# Patient Record
Sex: Female | Born: 1990
Health system: Southern US, Community
[De-identification: ages and names within clinical notes are randomized; demographics above are authoritative.]

## PROBLEM LIST (undated history)

## (undated) ENCOUNTER — Inpatient Hospital Stay (HOSPITAL_COMMUNITY): Payer: Self-pay

## (undated) DIAGNOSIS — M51369 Other intervertebral disc degeneration, lumbar region without mention of lumbar back pain or lower extremity pain: Secondary | ICD-10-CM

## (undated) DIAGNOSIS — M5136 Other intervertebral disc degeneration, lumbar region: Secondary | ICD-10-CM

## (undated) DIAGNOSIS — IMO0002 Reserved for concepts with insufficient information to code with codable children: Secondary | ICD-10-CM

## (undated) DIAGNOSIS — M479 Spondylosis, unspecified: Secondary | ICD-10-CM

## (undated) DIAGNOSIS — A64 Unspecified sexually transmitted disease: Secondary | ICD-10-CM

## (undated) DIAGNOSIS — K439 Ventral hernia without obstruction or gangrene: Secondary | ICD-10-CM

## (undated) HISTORY — PX: WISDOM TOOTH EXTRACTION: SHX21

## (undated) HISTORY — PX: CHOLECYSTECTOMY: SHX55

---
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
## 2012-07-29 ENCOUNTER — Other Ambulatory Visit (HOSPITAL_COMMUNITY)
Admission: RE | Admit: 2012-07-29 | Discharge: 2012-07-29 | Disposition: A | Discharge status: Home / Self Care | Payer: Self-pay | Source: Ambulatory Visit | Attending: Emergency Medicine | Admitting: Emergency Medicine

## 2012-07-29 ENCOUNTER — Encounter (HOSPITAL_COMMUNITY): Payer: Self-pay

## 2012-07-29 ENCOUNTER — Emergency Department (INDEPENDENT_AMBULATORY_CARE_PROVIDER_SITE_OTHER)
Admission: EM | Admit: 2012-07-29 | Discharge: 2012-07-29 | Disposition: A | Discharge status: Home / Self Care | Payer: Self-pay | Source: Home / Self Care | Attending: Emergency Medicine | Admitting: Emergency Medicine

## 2012-07-29 DIAGNOSIS — Z113 Encounter for screening for infections with a predominantly sexual mode of transmission: Secondary | ICD-10-CM | POA: Insufficient documentation

## 2012-07-29 DIAGNOSIS — N390 Urinary tract infection, site not specified: Secondary | ICD-10-CM

## 2012-07-29 DIAGNOSIS — N760 Acute vaginitis: Secondary | ICD-10-CM | POA: Insufficient documentation

## 2012-07-29 LAB — POCT URINALYSIS DIP (DEVICE)
Bilirubin Urine: NEGATIVE
Glucose, UA: NEGATIVE mg/dL
Ketones, ur: NEGATIVE mg/dL
Nitrite: NEGATIVE
Specific Gravity, Urine: 1.02 (ref 1.005–1.030)
pH: 8.5 — ABNORMAL HIGH (ref 5.0–8.0)

## 2012-07-29 LAB — POCT PREGNANCY, URINE: Preg Test, Ur: NEGATIVE

## 2012-07-29 MED ORDER — CEPHALEXIN 500 MG PO CAPS: 500.0000 mg | ORAL_CAPSULE | Freq: Three times a day (TID) | ORAL | Status: DC

## 2012-07-29 MED ORDER — PHENAZOPYRIDINE HCL 200 MG PO TABS: 200.0000 mg | ORAL_TABLET | Freq: Three times a day (TID) | ORAL | Status: DC | PRN

## 2012-07-29 NOTE — ED Provider Notes (Signed)
Chief Complaint:   Chief Complaint  Patient presents with  . Abdominal Pain    History of Present Illness:   Regina Nelson is a 22 year old female who has had a one-day history of urinary frequency, urgency, dysuria, blood in the urine, and lower abdominal and lower back pain. She denies fever, chills, nausea, or vomiting. She's had no GYN complaints but would like to be checked for STDs. She is sexually active without use of condoms although she does have an Implanad for contraception. No prior history of urinary tract infections.  Review of Systems:  Other than noted above, the patient denies any of the following symptoms: General:  No fevers, chills, sweats, aches, or fatigue. GI:  No abdominal pain, back pain, nausea, vomiting, diarrhea, or constipation. GU:  No dysuria, frequency, urgency, hematuria, or incontinence. GYN:  No discharge, itching, vulvar pain or lesions, pelvic pain, or abnormal vaginal bleeding.  PMFSH:  Past medical history, family history, social history, meds, and allergies were reviewed.    Physical Exam:   Vital signs:  BP 113/68  Pulse 61  Temp(Src) 98.1 F (36.7 C) (Oral)  Resp 16  SpO2 100% Gen:  Alert, oriented, in no distress. Lungs:  Clear to auscultation, no wheezes, rales or rhonchi. Heart:  Regular rhythm, no gallop or murmer. Abdomen:  Flat and soft. There was slight suprapubic pain to palpation.  No guarding, or rebound.  No hepato-splenomegaly or mass.  Bowel sounds were normally active.  No hernia. Pelvic exam:  Normal external genitalia, vaginal and cervical mucosa were normal. There was a tiny amount of blood coming from the cervical os. No pain on cervical motion. Uterus was normal in size and shape without any tenderness. No adnexal tenderness or mass. Back:  No CVA tenderness.  Skin:  Clear, warm and dry.  Labs:    Results for orders placed during the hospital encounter of 07/29/12  POCT URINALYSIS DIP (DEVICE)      Result Value Range    Glucose, UA NEGATIVE  NEGATIVE mg/dL   Bilirubin Urine NEGATIVE  NEGATIVE   Ketones, ur NEGATIVE  NEGATIVE mg/dL   Specific Gravity, Urine 1.020  1.005 - 1.030   Hgb urine dipstick LARGE (*) NEGATIVE   pH 8.5 (*) 5.0 - 8.0   Protein, ur 100 (*) NEGATIVE mg/dL   Urobilinogen, UA 1.0  0.0 - 1.0 mg/dL   Nitrite NEGATIVE  NEGATIVE   Leukocytes, UA SMALL (*) NEGATIVE  POCT PREGNANCY, URINE      Result Value Range   Preg Test, Ur NEGATIVE  NEGATIVE     A urine culture was obtained.  Results are pending at this time and we will call about any positive results. Also obtain DNA probes for gonorrhea, Chlamydia, Trichomonas, Gardnerella, Candida. Serology for HIV and syphilis also obtained.  Assessment: The encounter diagnosis was UTI (lower urinary tract infection).   This appears to be a simple lower urinary tract infection without evidence of pyelonephritis. She does not have any evidence of STDs today, but have screen for all of the major STDs.  Plan:   1.  The following meds were prescribed:   Discharge Medication List as of 07/29/2012  5:34 PM    START taking these medications   Details  cephALEXin (KEFLEX) 500 MG capsule Take 1 capsule (500 mg total) by mouth 3 (three) times daily., Starting 07/29/2012, Until Discontinued, Normal    phenazopyridine (PYRIDIUM) 200 MG tablet Take 1 tablet (200 mg total) by mouth 3 (three)  times daily as needed for pain., Starting 07/29/2012, Until Discontinued, Normal       2.  The patient was instructed in symptomatic care and handouts were given. 3.  The patient was told to return if becoming worse in any way, if no better in 3 or 4 days, and given some red flag symptoms such as fever or vomiting that would indicate earlier return. 4.  The patient was told to avoid intercourse for 10 days, get extra fluids, and return for a follow up with her primary care doctor at the completion of treatment for a repeat UA and culture.     Reuben Likes,  MD 07/29/12 [phone]

## 2012-07-29 NOTE — ED Notes (Signed)
C/o onset UA frequency, pain lower abdominal area . Last BM aprox 2 days ago; no concerns for STD at this time

## 2012-07-30 LAB — HIV ANTIBODY (ROUTINE TESTING W REFLEX): HIV: NONREACTIVE

## 2012-07-31 ENCOUNTER — Emergency Department (INDEPENDENT_AMBULATORY_CARE_PROVIDER_SITE_OTHER)
Admission: EM | Admit: 2012-07-31 | Discharge: 2012-07-31 | Disposition: A | Discharge status: Home / Self Care | Payer: Self-pay | Source: Home / Self Care

## 2012-07-31 ENCOUNTER — Encounter (HOSPITAL_COMMUNITY): Payer: Self-pay | Admitting: *Deleted

## 2012-07-31 DIAGNOSIS — A5400 Gonococcal infection of lower genitourinary tract, unspecified: Secondary | ICD-10-CM

## 2012-07-31 DIAGNOSIS — A499 Bacterial infection, unspecified: Secondary | ICD-10-CM

## 2012-07-31 DIAGNOSIS — B9689 Other specified bacterial agents as the cause of diseases classified elsewhere: Secondary | ICD-10-CM

## 2012-07-31 DIAGNOSIS — A549 Gonococcal infection, unspecified: Secondary | ICD-10-CM

## 2012-07-31 DIAGNOSIS — N760 Acute vaginitis: Secondary | ICD-10-CM

## 2012-07-31 LAB — URINE CULTURE: Special Requests: NORMAL

## 2012-07-31 MED ORDER — AZITHROMYCIN 250 MG PO TABS
1000.0000 mg | ORAL_TABLET | Freq: Once | ORAL | Status: AC
  Administered 2012-07-31: 1000 mg via ORAL

## 2012-07-31 MED ORDER — AZITHROMYCIN 250 MG PO TABS
ORAL_TABLET | ORAL | Status: AC
  Filled 2012-07-31: qty 4

## 2012-07-31 MED ORDER — METRONIDAZOLE 500 MG PO TABS: 500.0000 mg | ORAL_TABLET | Freq: Two times a day (BID) | ORAL | Status: DC

## 2012-07-31 MED ORDER — CEFTRIAXONE SODIUM 250 MG IJ SOLR
250.0000 mg | Freq: Once | INTRAMUSCULAR | Status: AC
  Administered 2012-07-31: 250 mg via INTRAMUSCULAR

## 2012-07-31 MED ORDER — CEFTRIAXONE SODIUM 250 MG IJ SOLR
INTRAMUSCULAR | Status: AC
  Filled 2012-07-31: qty 250

## 2012-07-31 NOTE — ED Provider Notes (Signed)
Medical screening examination/treatment/procedure(s) were performed by non-physician practitioner and as supervising physician I was immediately available for consultation/collaboration.  Leslee Home, M.D.   Reuben Likes, MD 07/31/12 [phone]

## 2012-07-31 NOTE — ED Notes (Addendum)
GC pos., Chlamydia neg., Affirm: Candida and Trich neg., Gardnerella pos.,  HIV/RPR non-reactive, Urine culture pending.  Pt. called by Dr. Lorenz Coaster to come back for tx. on 6/11. Pt. came in today and was treated with Rocephin and Zithromax. Pt. got a Rx. for Flagyl for bacterial vaginosis.   DHHS form completed and faxed to the [facility] Department. Regina Nelson 07/31/2012 Urine culture >100,000 colonies E. Coli. Pt. adequately treated with Keflex. 08/01/2012

## 2012-07-31 NOTE — ED Notes (Signed)
Chart review for recheck

## 2012-07-31 NOTE — ED Notes (Signed)
Pt  States   She  Was  Told  By  Dr  Lorenz Coaster  To  Return  For  tx   Of  Positive  Test  result

## 2012-07-31 NOTE — ED Provider Notes (Signed)
History     CSN: [id number]  Arrival date & time 07/31/12  1111   First MD Initiated Contact with Patient 07/31/12 1249      Chief Complaint  Patient presents with  . Follow-up    (Consider location/radiation/quality/duration/timing/severity/associated sxs/prior treatment) HPI Comments: The patient presents for followup. Her labs were positive for BV and and for gonorrhea so she was called and told to come in for followup.. She is currently being treated for a urinary tract infection with Keflex. She denies any nausea, vomiting, abdominal pain, fever, chills, vaginal discharge.   Past Medical History  Diagnosis Date  . Degenerative disc disease     Past Surgical History  Procedure Laterality Date  . Cholecystectomy      Family History  Problem Relation Age of Onset  . Colon cancer Neg Hx   . Cervical cancer Maternal Aunt     History  Substance Use Topics  . Smoking status: Never Smoker   . Smokeless tobacco: Never Used  . Alcohol Use: No    OB History   Grav Para Term Preterm Abortions TAB SAB Ect Mult Living                  Review of Systems  Constitutional: Negative for fever and chills.  Eyes: Negative for visual disturbance.  Respiratory: Negative for cough and shortness of breath.   Cardiovascular: Negative for chest pain, palpitations and leg swelling.  Gastrointestinal: Negative for nausea, vomiting and abdominal pain.  Endocrine: Negative for polydipsia and polyuria.  Genitourinary: Negative for dysuria, urgency and frequency.  Musculoskeletal: Negative for myalgias and arthralgias.  Skin: Negative for rash.  Neurological: Negative for dizziness, weakness and light-headedness.    Allergies  Review of patient's allergies indicates no known allergies.  Home Medications   Current Outpatient Rx  Name  Route  Sig  Dispense  Refill  . acetaminophen (TYLENOL) 500 MG tablet   Oral   Take 1,000 mg by mouth every 6 (six) hours as needed. For pain        . EXPIRED: amoxicillin-clarithromycin-lansoprazole (PREVPAC) combo pack   Oral   Take by mouth 2 (two) times daily. Follow package directions.   1 kit   0   . cephALEXin (KEFLEX) 500 MG capsule   Oral   Take 1 capsule (500 mg total) by mouth 3 (three) times daily.   30 capsule   0   . Etonogestrel (IMPLANON [place])   Subcutaneous   Inject into the skin.           Marland Kitchen metroNIDAZOLE (FLAGYL) 500 MG tablet   Oral   Take 1 tablet (500 mg total) by mouth 2 (two) times daily.   14 tablet   0   . EXPIRED: omeprazole (PRILOSEC) 40 MG capsule   Oral   Take 1 capsule (40 mg total) by mouth daily.   90 capsule   3   . phenazopyridine (PYRIDIUM) 200 MG tablet   Oral   Take 1 tablet (200 mg total) by mouth 3 (three) times daily as needed for pain.   15 tablet   0     BP 118/72  Pulse 72  Temp(Src) 98.6 F (37 C) (Oral)  Resp 16  SpO2 100%  LMP 07/20/2012  Physical Exam  Nursing note and vitals reviewed. Constitutional: She is oriented to person, place, and time. Vital signs are normal. She appears well-developed and well-nourished. No distress.  HENT:  Head: Normocephalic and atraumatic.  Abdominal: There is  no tenderness.  Neurological: She is alert and oriented to person, place, and time. She has normal strength.  Skin: Skin is warm and dry. She is not diaphoretic.  Psychiatric: She has a normal mood and affect. Her behavior is normal. Judgment normal.    ED Course  Procedures (including critical care time)  Labs Reviewed - No data to display No results found.   1. BV (bacterial vaginosis)   2. Gonorrhea       MDM  The patient had a lot of questions about what these diagnoses the mean and how she could have gotten them. All of these questions were answered to the patient's satisfaction  Rocephin and azithromycin given in the office. discharged with prescription for Flagyl.   Meds ordered this encounter  Medications  . cefTRIAXone (ROCEPHIN)  injection 250 mg    Sig:   . azithromycin (ZITHROMAX) tablet 1,000 mg    Sig:   . metroNIDAZOLE (FLAGYL) 500 MG tablet    Sig: Take 1 tablet (500 mg total) by mouth 2 (two) times daily.    Dispense:  14 tablet    Refill:  0         Graylon Good, PA-C 07/31/12 1558

## 2013-01-20 ENCOUNTER — Emergency Department (HOSPITAL_COMMUNITY)
Admission: EM | Admit: 2013-01-20 | Discharge: 2013-01-20 | Disposition: A | Discharge status: Home / Self Care | Payer: Self-pay | Attending: Emergency Medicine | Admitting: Emergency Medicine

## 2013-01-20 ENCOUNTER — Encounter (HOSPITAL_COMMUNITY): Payer: Self-pay | Admitting: Emergency Medicine

## 2013-01-20 DIAGNOSIS — Z8619 Personal history of other infectious and parasitic diseases: Secondary | ICD-10-CM | POA: Insufficient documentation

## 2013-01-20 DIAGNOSIS — N390 Urinary tract infection, site not specified: Secondary | ICD-10-CM | POA: Insufficient documentation

## 2013-01-20 DIAGNOSIS — N879 Dysplasia of cervix uteri, unspecified: Secondary | ICD-10-CM | POA: Insufficient documentation

## 2013-01-20 DIAGNOSIS — Z3202 Encounter for pregnancy test, result negative: Secondary | ICD-10-CM | POA: Insufficient documentation

## 2013-01-20 DIAGNOSIS — Z79899 Other long term (current) drug therapy: Secondary | ICD-10-CM | POA: Insufficient documentation

## 2013-01-20 DIAGNOSIS — R197 Diarrhea, unspecified: Secondary | ICD-10-CM | POA: Insufficient documentation

## 2013-01-20 HISTORY — DX: Unspecified sexually transmitted disease: A64

## 2013-01-20 LAB — CBC WITH DIFFERENTIAL/PLATELET
Eosinophils Absolute: 0.1 10*3/uL (ref 0.0–0.7)
Eosinophils Relative: 1 % (ref 0–5)
HCT: 43.4 % (ref 36.0–46.0)
Lymphocytes Relative: 40 % (ref 12–46)
Lymphs Abs: 3.6 10*3/uL (ref 0.7–4.0)
MCH: 28.6 pg (ref 26.0–34.0)
MCV: 84.9 fL (ref 78.0–100.0)
Monocytes Absolute: 0.8 10*3/uL (ref 0.1–1.0)
Monocytes Relative: 9 % (ref 3–12)
Platelets: 252 10*3/uL (ref 150–400)
RBC: 5.11 MIL/uL (ref 3.87–5.11)
WBC: 9.1 10*3/uL (ref 4.0–10.5)

## 2013-01-20 LAB — URINALYSIS, ROUTINE W REFLEX MICROSCOPIC
Bilirubin Urine: NEGATIVE
Nitrite: NEGATIVE
Specific Gravity, Urine: 1.029 (ref 1.005–1.030)
Urobilinogen, UA: 1 mg/dL (ref 0.0–1.0)
pH: 6.5 (ref 5.0–8.0)

## 2013-01-20 LAB — COMPREHENSIVE METABOLIC PANEL
ALT: 20 U/L (ref 0–35)
BUN: 16 mg/dL (ref 6–23)
CO2: 24 mEq/L (ref 19–32)
Calcium: 9.4 mg/dL (ref 8.4–10.5)
Creatinine, Ser: 0.78 mg/dL (ref 0.50–1.10)
GFR calc Af Amer: >90 mL/min (ref >90)
GFR calc non Af Amer: >90 mL/min (ref >90)
Glucose, Bld: 95 mg/dL (ref 70–99)
Sodium: 137 mEq/L (ref 135–145)
Total Protein: 7.1 g/dL (ref 6.0–8.3)

## 2013-01-20 LAB — URINE MICROSCOPIC-ADD ON

## 2013-01-20 LAB — WET PREP, GENITAL
Trich, Wet Prep: NONE SEEN
Yeast Wet Prep HPF POC: NONE SEEN

## 2013-01-20 MED ORDER — CEFTRIAXONE SODIUM 250 MG IJ SOLR
250.0000 mg | Freq: Once | INTRAMUSCULAR | Status: AC
  Administered 2013-01-20: 250 mg via INTRAMUSCULAR
  Filled 2013-01-20: qty 250

## 2013-01-20 MED ORDER — AZITHROMYCIN 250 MG PO TABS
1000.0000 mg | ORAL_TABLET | Freq: Once | ORAL | Status: AC
  Administered 2013-01-20: 1000 mg via ORAL
  Filled 2013-01-20: qty 4

## 2013-01-20 MED ORDER — STERILE WATER FOR INJECTION IJ SOLN
INTRAMUSCULAR | Status: AC
  Administered 2013-01-20: 0.9 mL
  Filled 2013-01-20: qty 10

## 2013-01-20 MED ORDER — NITROFURANTOIN MONOHYD MACRO 100 MG PO CAPS: 100.0000 mg | ORAL_CAPSULE | Freq: Two times a day (BID) | ORAL | Status: DC

## 2013-01-20 NOTE — ED Notes (Signed)
Pt. reports low abdominal pain for 1 week , denies nausea , vomitting or diarrhea , no urinary discomfort or vagianal discharge . Pt. requesting STD check .

## 2013-01-20 NOTE — ED Provider Notes (Signed)
CSN: [id number]     Arrival date & time 01/20/13  1945 History   First MD Initiated Contact with Patient 01/20/13 2012     Chief Complaint  Patient presents with  . Abdominal Pain   (Consider location/radiation/quality/duration/timing/severity/associated sxs/prior Treatment) HPI Comments: Patient is a 22 year old female who presents for lower abdominal pain x 1 week. Patient states the pain is intermittent and cramping in nature. She has taken Tylenol for her pain with mild relief. Patient denies any aggravating factors of her symptoms or any triggers which causes recurrence of her abdominal pain. Patient does endorse some sporadic watery, nonbloody diarrhea. Patient denies associated fever, chest pain, shortness of breath, nausea or vomiting, melena or hematochezia, urinary symptoms, vaginal discharge, or numbness or tingling. Last menstrual period was 2 months ago when patient endorses being sexually active.  Patient is a 22 y.o. female presenting with abdominal pain. The history is provided by the patient. No language interpreter was used.  Abdominal Pain Associated symptoms: diarrhea     Past Medical History  Diagnosis Date  . Degenerative disc disease   . STD (female)    Past Surgical History  Procedure Laterality Date  . Cholecystectomy     Family History  Problem Relation Age of Onset  . Colon cancer Neg Hx   . Cervical cancer Maternal Aunt    History  Substance Use Topics  . Smoking status: Never Smoker   . Smokeless tobacco: Never Used  . Alcohol Use: No   OB History   Grav Para Term Preterm Abortions TAB SAB Ect Mult Living                 Review of Systems  Gastrointestinal: Positive for abdominal pain and diarrhea.  All other systems reviewed and are negative.    Allergies  Review of patient's allergies indicates no known allergies.  Home Medications   Current Outpatient Rx  Name  Route  Sig  Dispense  Refill  . acetaminophen (TYLENOL) 500 MG tablet  Oral   Take 1,000 mg by mouth every 6 (six) hours as needed. For pain         . EXPIRED: amoxicillin-clarithromycin-lansoprazole (PREVPAC) combo pack   Oral   Take by mouth 2 (two) times daily. Follow package directions.   1 kit   0   . nitrofurantoin, macrocrystal-monohydrate, (MACROBID) 100 MG capsule   Oral   Take 1 capsule (100 mg total) by mouth 2 (two) times daily.   10 capsule   0   . EXPIRED: omeprazole (PRILOSEC) 40 MG capsule   Oral   Take 1 capsule (40 mg total) by mouth daily.   90 capsule   3    BP 118/76  Pulse 67  Resp 16  SpO2 96%  Physical Exam  Nursing note and vitals reviewed. Constitutional: She is oriented to person, place, and time. She appears well-developed and well-nourished. No distress.  HENT:  Head: Normocephalic and atraumatic.  Mouth/Throat: Oropharynx is clear and moist. No oropharyngeal exudate.  Eyes: Conjunctivae and EOM are normal. Pupils are equal, round, and reactive to light. No scleral icterus.  Neck: Normal range of motion.  Cardiovascular: Normal rate, regular rhythm and normal heart sounds.   Pulmonary/Chest: Effort normal and breath sounds normal. No respiratory distress. She has no wheezes. She has no rales.  Abdominal: Soft. She exhibits no distension. There is tenderness (mild, lower abdomen). There is no rebound and no guarding.    No peritoneal signs or guarding.  Genitourinary:  Vagina normal. There is no rash, tenderness, lesion or injury on the right labia. There is no rash, tenderness, lesion or injury on the left labia. Uterus is not tender. Cervix exhibits no motion tenderness, no discharge and no friability. Right adnexum displays no mass, no tenderness and no fullness. Left adnexum displays no mass, no tenderness and no fullness.  +cervical dysplasia around os  Musculoskeletal: Normal range of motion.  Neurological: She is alert and oriented to person, place, and time.  Skin: Skin is warm and dry. No rash noted. She  is not diaphoretic. No erythema. No pallor.  Psychiatric: She has a normal mood and affect. Her behavior is normal.    ED Course  Procedures (including critical care time) Labs Review Labs Reviewed  WET PREP, GENITAL - Abnormal; Notable for the following:    WBC, Wet Prep HPF POC FEW (*)    All other components within normal limits  COMPREHENSIVE METABOLIC PANEL - Abnormal; Notable for the following:    Total Bilirubin 0.2 (*)    All other components within normal limits  URINALYSIS, ROUTINE W REFLEX MICROSCOPIC - Abnormal; Notable for the following:    APPearance HAZY (*)    Hgb urine dipstick LARGE (*)    Ketones, ur 15 (*)    Leukocytes, UA MODERATE (*)    All other components within normal limits  URINE MICROSCOPIC-ADD ON - Abnormal; Notable for the following:    Squamous Epithelial / LPF FEW (*)    Bacteria, UA MANY (*)    All other components within normal limits  GC/CHLAMYDIA PROBE AMP  URINE CULTURE  CBC WITH DIFFERENTIAL  LIPASE, BLOOD  POCT PREGNANCY, URINE   Imaging Review No results found.  EKG Interpretation   None       MDM   1. UTI (urinary tract infection)    22 year old female presents today for lower abdominal pain times one week. Patient well and nontoxic appearing and hemodynamically stable, and afebrile. Physical exam today significant for mild tenderness to palpation in the suprapubic region. Labs are unremarkable and urine pregnancy negative. Urinalysis today suggests UTI given many bacteria and moderate leukocytes and presents a few squamous cells. GC chlamydia pending.  Have reviewed these findings with the patient in their entirety who verbalizes understanding. She states that she has some concern for STDs as she has contracted this from her boyfriend in the past. She endorses unprotected sexual intercourse. Will treat the patient today with IM Rocephin and azithromycin and have instructed the patient to followup on her gonorrhea and Chlamydia  culture. Patient stable for discharge with prescription for Macrobid for urinary tract infection. Have also provided referral to women's outpatient clinic. Return precautions discussed and patient agreeable to plan with no unaddressed concerns.  Antony Madura, PA-C 01/20/13 2214

## 2013-01-21 ENCOUNTER — Emergency Department (HOSPITAL_COMMUNITY)
Admission: EM | Admit: 2013-01-21 | Discharge: 2013-01-21 | Disposition: A | Discharge status: Home / Self Care | Payer: Self-pay | Attending: Emergency Medicine | Admitting: Emergency Medicine

## 2013-01-21 ENCOUNTER — Encounter (HOSPITAL_COMMUNITY): Payer: Self-pay | Admitting: Emergency Medicine

## 2013-01-21 DIAGNOSIS — N390 Urinary tract infection, site not specified: Secondary | ICD-10-CM | POA: Insufficient documentation

## 2013-01-21 DIAGNOSIS — Z791 Long term (current) use of non-steroidal anti-inflammatories (NSAID): Secondary | ICD-10-CM | POA: Insufficient documentation

## 2013-01-21 DIAGNOSIS — Z8619 Personal history of other infectious and parasitic diseases: Secondary | ICD-10-CM | POA: Insufficient documentation

## 2013-01-21 DIAGNOSIS — IMO0002 Reserved for concepts with insufficient information to code with codable children: Secondary | ICD-10-CM | POA: Insufficient documentation

## 2013-01-21 DIAGNOSIS — R109 Unspecified abdominal pain: Secondary | ICD-10-CM

## 2013-01-21 DIAGNOSIS — Z79899 Other long term (current) drug therapy: Secondary | ICD-10-CM | POA: Insufficient documentation

## 2013-01-21 DIAGNOSIS — R1084 Generalized abdominal pain: Secondary | ICD-10-CM | POA: Insufficient documentation

## 2013-01-21 DIAGNOSIS — Z9089 Acquired absence of other organs: Secondary | ICD-10-CM | POA: Insufficient documentation

## 2013-01-21 LAB — GC/CHLAMYDIA PROBE AMP: CT Probe RNA: NEGATIVE

## 2013-01-21 MED ORDER — ONDANSETRON 8 MG PO TBDP
8.0000 mg | ORAL_TABLET | Freq: Once | ORAL | Status: AC
  Administered 2013-01-21: 8 mg via ORAL
  Filled 2013-01-21: qty 1

## 2013-01-21 MED ORDER — PHENAZOPYRIDINE HCL 200 MG PO TABS: 200.0000 mg | ORAL_TABLET | Freq: Three times a day (TID) | ORAL | Status: DC

## 2013-01-21 MED ORDER — TRAMADOL HCL 50 MG PO TABS
50.0000 mg | ORAL_TABLET | Freq: Four times a day (QID) | ORAL | Status: DC | PRN
  Administered 2013-01-21: 50 mg via ORAL
  Filled 2013-01-21: qty 1

## 2013-01-21 MED ORDER — TRAMADOL HCL 50 MG PO TABS: 50.0000 mg | ORAL_TABLET | Freq: Four times a day (QID) | ORAL | Status: DC | PRN

## 2013-01-21 MED ORDER — ONDANSETRON 8 MG PO TBDP: 8.0000 mg | ORAL_TABLET | Freq: Once | ORAL | Status: DC

## 2013-01-21 MED ORDER — NAPROXEN 500 MG PO TABS: 500.0000 mg | ORAL_TABLET | Freq: Two times a day (BID) | ORAL | Status: DC

## 2013-01-21 MED ORDER — NAPROXEN 500 MG PO TABS
500.0000 mg | ORAL_TABLET | Freq: Two times a day (BID) | ORAL | Status: DC
  Administered 2013-01-21: 500 mg via ORAL
  Filled 2013-01-21: qty 1

## 2013-01-21 MED ORDER — PHENAZOPYRIDINE HCL 200 MG PO TABS
200.0000 mg | ORAL_TABLET | Freq: Three times a day (TID) | ORAL | Status: DC
  Administered 2013-01-21: 200 mg via ORAL
  Filled 2013-01-21: qty 1

## 2013-01-21 NOTE — ED Notes (Signed)
Dr Otter at bedside  

## 2013-01-21 NOTE — ED Provider Notes (Signed)
Medical screening examination/treatment/procedure(s) were performed by non-physician practitioner and as supervising physician I was immediately available for consultation/collaboration.  EKG Interpretation   None         Candyce Churn, MD 01/21/13 1250

## 2013-01-21 NOTE — ED Notes (Signed)
Bed: ZO10 Expected date:  Expected time:  Means of arrival:  Comments: EMS 22 yo F, abd pain; dc'd from [facility] at 2300

## 2013-01-21 NOTE — ED Notes (Signed)
Per PTAR report: pt seen earlier at [facility] for similar complaints and pt was dx for a UTI.  Pt left [facility] at 23:00 on 12/2 and pain began about 30 minutes ago.  Pain is similar to before.  PTAR reports pain is tender to palpation.  Pt a/o x 4 and Skin warm and dry.

## 2013-01-21 NOTE — ED Provider Notes (Signed)
CSN: [id number]     Arrival date & time 01/21/13  0121 History   First MD Initiated Contact with Patient 01/21/13 0123     Chief Complaint  Patient presents with  . Abdominal Pain   (Consider location/radiation/quality/duration/timing/severity/associated sxs/prior Treatment) HPI 22 year old female presents to emergency room from home via EMS with complaint of diffuse abdominal pain.  Patient was discharged from [facility] emergency department about 2 hours ago.  Patient was seen for abdominal pain, diagnosed with possible STD and UTI.  She was given Rocephin and Zithromax.  Given prescription for nitrofurantoin.  Upon arrival home, patient reports diffuse pain that was sharp and crampy.  Prior, she reports she only had suprapubic pain.  This concerned her and her mother.  Who recommended calling an ambulance to come into the emergency room for reassessment.  No vomiting.  The patient has been nauseated.  No fevers.  Patient did not take any medications prior to arrival. Past Medical History  Diagnosis Date  . Degenerative disc disease   . STD (female)    Past Surgical History  Procedure Laterality Date  . Cholecystectomy     Family History  Problem Relation Age of Onset  . Colon cancer Neg Hx   . Cervical cancer Maternal Aunt    History  Substance Use Topics  . Smoking status: Never Smoker   . Smokeless tobacco: Never Used  . Alcohol Use: No   OB History   Grav Para Term Preterm Abortions TAB SAB Ect Mult Living                 Review of Systems  All other systems reviewed and are negative.    Allergies  Review of patient's allergies indicates no known allergies.  Home Medications   Current Outpatient Rx  Name  Route  Sig  Dispense  Refill  . acetaminophen (TYLENOL) 500 MG tablet   Oral   Take 1,000 mg by mouth every 6 (six) hours as needed. For pain         . etonogestrel (IMPLANON) 68 MG IMPL implant   Subcutaneous   Inject 1 each into the skin continuous.         . naproxen (NAPROSYN) 500 MG tablet   Oral   Take 1 tablet (500 mg total) by mouth 2 (two) times daily with a meal.   30 tablet   0   . nitrofurantoin, macrocrystal-monohydrate, (MACROBID) 100 MG capsule   Oral   Take 1 capsule (100 mg total) by mouth 2 (two) times daily.   10 capsule   0   . EXPIRED: omeprazole (PRILOSEC) 40 MG capsule   Oral   Take 1 capsule (40 mg total) by mouth daily.   90 capsule   3   . ondansetron (ZOFRAN-ODT) 8 MG disintegrating tablet   Oral   Take 1 tablet (8 mg total) by mouth once.   20 tablet   0   . phenazopyridine (PYRIDIUM) 200 MG tablet   Oral   Take 1 tablet (200 mg total) by mouth 3 (three) times daily with meals.   10 tablet   0   . traMADol (ULTRAM) 50 MG tablet   Oral   Take 1 tablet (50 mg total) by mouth every 6 (six) hours as needed for moderate pain.   30 tablet   0    BP 132/78  Pulse 65  Temp(Src) 97.9 F (36.6 C) (Oral)  Resp 18  SpO2 97% Physical Exam  Constitutional: She  is oriented to person, place, and time. She appears well-developed and well-nourished. She appears distressed.  HENT:  Head: Normocephalic and atraumatic.  Nose: Nose normal.  Mouth/Throat: Oropharynx is clear and moist.  Eyes: Conjunctivae and EOM are normal. Pupils are equal, round, and reactive to light.  Neck: Normal range of motion. Neck supple. No JVD present. No tracheal deviation present. No thyromegaly present.  Cardiovascular: Normal rate, regular rhythm, normal heart sounds and intact distal pulses.  Exam reveals no gallop and no friction rub.   No murmur heard. Pulmonary/Chest: Effort normal and breath sounds normal. No stridor. No respiratory distress. She has no wheezes. She has no rales. She exhibits no tenderness.  Abdominal: Soft. Bowel sounds are normal. She exhibits no distension and no mass. There is tenderness (diffuse tenderness). There is no rebound and no guarding.  Musculoskeletal: Normal range of motion. She exhibits no  edema and no tenderness.  Lymphadenopathy:    She has no cervical adenopathy.  Neurological: She is alert and oriented to person, place, and time. She exhibits normal muscle tone. Coordination normal.  Skin: Skin is warm and dry. No rash noted. No erythema. No pallor.  Psychiatric: She has a normal mood and affect. Her behavior is normal. Judgment and thought content normal.    ED Course  Procedures (including critical care time) Labs Review Labs Reviewed - No data to display Imaging Review No results found.  EKG Interpretation   None       MDM   1. Abdominal pain   2. Urinary tract infection    22 year old female with diffuse at all.  Pain.  She had a thorough examination in the emergency room just a few hours ago to include labs and pelvic exam.  Patient given Pyridium, Zofran, Naprosyn and Ultram here.  She is feeling better.  Will DC her home on same.    Olivia Mackie, MD 01/21/13 0630

## 2013-01-22 ENCOUNTER — Encounter (HOSPITAL_COMMUNITY): Payer: Self-pay | Admitting: Emergency Medicine

## 2013-01-22 ENCOUNTER — Emergency Department (HOSPITAL_COMMUNITY)
Admission: EM | Admit: 2013-01-22 | Discharge: 2013-01-22 | Disposition: A | Discharge status: Home / Self Care | Payer: Self-pay | Attending: Emergency Medicine | Admitting: Emergency Medicine

## 2013-01-22 DIAGNOSIS — R112 Nausea with vomiting, unspecified: Secondary | ICD-10-CM | POA: Insufficient documentation

## 2013-01-22 DIAGNOSIS — Z79899 Other long term (current) drug therapy: Secondary | ICD-10-CM | POA: Insufficient documentation

## 2013-01-22 DIAGNOSIS — R1084 Generalized abdominal pain: Secondary | ICD-10-CM | POA: Insufficient documentation

## 2013-01-22 DIAGNOSIS — Z791 Long term (current) use of non-steroidal anti-inflammatories (NSAID): Secondary | ICD-10-CM | POA: Insufficient documentation

## 2013-01-22 DIAGNOSIS — IMO0002 Reserved for concepts with insufficient information to code with codable children: Secondary | ICD-10-CM | POA: Insufficient documentation

## 2013-01-22 DIAGNOSIS — Z8619 Personal history of other infectious and parasitic diseases: Secondary | ICD-10-CM | POA: Insufficient documentation

## 2013-01-22 DIAGNOSIS — Z9089 Acquired absence of other organs: Secondary | ICD-10-CM | POA: Insufficient documentation

## 2013-01-22 DIAGNOSIS — R109 Unspecified abdominal pain: Secondary | ICD-10-CM

## 2013-01-22 LAB — CBC WITH DIFFERENTIAL/PLATELET
Basophils Relative: 0 % (ref 0–1)
Eosinophils Relative: 1 % (ref 0–5)
HCT: 43.4 % (ref 36.0–46.0)
Hemoglobin: 14.5 g/dL (ref 12.0–15.0)
Lymphocytes Relative: 30 % (ref 12–46)
Lymphs Abs: 3 10*3/uL (ref 0.7–4.0)
MCHC: 33.4 g/dL (ref 30.0–36.0)
MCV: 83.5 fL (ref 78.0–100.0)
Monocytes Absolute: 1 10*3/uL (ref 0.1–1.0)
Monocytes Relative: 11 % (ref 3–12)
Neutro Abs: 5.8 10*3/uL (ref 1.7–7.7)
RBC: 5.2 MIL/uL — ABNORMAL HIGH (ref 3.87–5.11)
RDW: 13.1 % (ref 11.5–15.5)

## 2013-01-22 LAB — COMPREHENSIVE METABOLIC PANEL
Albumin: 3.7 g/dL (ref 3.5–5.2)
Alkaline Phosphatase: 44 U/L (ref 39–117)
BUN: 14 mg/dL (ref 6–23)
CO2: 24 mEq/L (ref 19–32)
Calcium: 8.8 mg/dL (ref 8.4–10.5)
Chloride: 101 mEq/L (ref 96–112)
Creatinine, Ser: 0.73 mg/dL (ref 0.50–1.10)
GFR calc non Af Amer: >90 mL/min (ref >90)
Total Bilirubin: 0.3 mg/dL (ref 0.3–1.2)

## 2013-01-22 LAB — URINE CULTURE

## 2013-01-22 LAB — LIPASE, BLOOD: Lipase: 14 U/L (ref 11–59)

## 2013-01-22 MED ORDER — MORPHINE SULFATE 4 MG/ML IJ SOLN
4.0000 mg | Freq: Once | INTRAMUSCULAR | Status: AC
  Administered 2013-01-22: 4 mg via INTRAVENOUS
  Filled 2013-01-22: qty 1

## 2013-01-22 MED ORDER — PROMETHAZINE HCL 25 MG RE SUPP: 25.0000 mg | Freq: Four times a day (QID) | RECTAL | Status: DC | PRN

## 2013-01-22 MED ORDER — DICYCLOMINE HCL 10 MG/ML IM SOLN
20.0000 mg | Freq: Once | INTRAMUSCULAR | Status: AC
  Administered 2013-01-22: 20 mg via INTRAMUSCULAR
  Filled 2013-01-22: qty 2

## 2013-01-22 MED ORDER — SUCRALFATE 1 G PO TABS: 1.0000 g | ORAL_TABLET | Freq: Four times a day (QID) | ORAL | Status: DC

## 2013-01-22 MED ORDER — FAMOTIDINE 20 MG PO TABS: 20.0000 mg | ORAL_TABLET | Freq: Two times a day (BID) | ORAL | Status: DC

## 2013-01-22 MED ORDER — PANTOPRAZOLE SODIUM 40 MG IV SOLR
40.0000 mg | Freq: Once | INTRAVENOUS | Status: AC
  Administered 2013-01-22: 40 mg via INTRAVENOUS
  Filled 2013-01-22: qty 40

## 2013-01-22 MED ORDER — GI COCKTAIL ~~LOC~~
30.0000 mL | Freq: Once | ORAL | Status: AC
  Administered 2013-01-22: 30 mL via ORAL
  Filled 2013-01-22: qty 30

## 2013-01-22 MED ORDER — FENTANYL CITRATE 0.05 MG/ML IJ SOLN
50.0000 ug | Freq: Once | INTRAMUSCULAR | Status: AC
  Administered 2013-01-22: 50 ug via INTRAVENOUS
  Filled 2013-01-22: qty 2

## 2013-01-22 MED ORDER — ONDANSETRON 8 MG PO TBDP: 8.0000 mg | ORAL_TABLET | Freq: Three times a day (TID) | ORAL | Status: DC | PRN

## 2013-01-22 MED ORDER — ONDANSETRON HCL 4 MG/2ML IJ SOLN
4.0000 mg | Freq: Once | INTRAMUSCULAR | Status: AC
  Administered 2013-01-22: 4 mg via INTRAVENOUS
  Filled 2013-01-22: qty 2

## 2013-01-22 MED ORDER — SODIUM CHLORIDE 0.9 % IV BOLUS (SEPSIS)
1000.0000 mL | Freq: Once | INTRAVENOUS | Status: AC
  Administered 2013-01-22: 1000 mL via INTRAVENOUS

## 2013-01-22 NOTE — ED Provider Notes (Signed)
CSN: [id number]     Arrival date & time 01/22/13  0033 History   First MD Initiated Contact with Patient 01/22/13 0102     Chief Complaint  Patient presents with  . Abdominal Pain   (Consider location/radiation/quality/duration/timing/severity/associated sxs/prior Treatment) HPI 22 year old female presents to the emergency department from home via EMS with complaint of persistent abdominal pain, nausea and vomiting.  Pt was seen last night at [facility] with normal blood work, possible UTI, and tx for gc/chlamydia.  Pt was seen later in the night by me at [facility] due to persistent pain.  Pt was given naprosyn, tramadol, pyridium at that time, felt better.  Pt reports she could not afford the naprosyn or pyridium, but did fill the macrobid and ultram.  Pt reports persistent suprapubic pain, epigastric pain.  No fevers or chills.  She has had vomiting, but has been able to keep down her medications.     Past Medical History  Diagnosis Date  . Degenerative disc disease   . STD (female)    Past Surgical History  Procedure Laterality Date  . Cholecystectomy     Family History  Problem Relation Age of Onset  . Colon cancer Neg Hx   . Cervical cancer Maternal Aunt    History  Substance Use Topics  . Smoking status: Never Smoker   . Smokeless tobacco: Never Used  . Alcohol Use: No   OB History   Grav Para Term Preterm Abortions TAB SAB Ect Mult Living                 Review of Systems  All other systems reviewed and are negative.    Allergies  Review of patient's allergies indicates no known allergies.  Home Medications   Current Outpatient Rx  Name  Route  Sig  Dispense  Refill  . acetaminophen (TYLENOL) 500 MG tablet   Oral   Take 1,000 mg by mouth every 6 (six) hours as needed. For pain         . naproxen (NAPROSYN) 500 MG tablet   Oral   Take 1 tablet (500 mg total) by mouth 2 (two) times daily with a meal.   30 tablet   0   . nitrofurantoin, macrocrystal-monohydrate,  (MACROBID) 100 MG capsule   Oral   Take 1 capsule (100 mg total) by mouth 2 (two) times daily.   10 capsule   0   . phenazopyridine (PYRIDIUM) 200 MG tablet   Oral   Take 1 tablet (200 mg total) by mouth 3 (three) times daily with meals.   10 tablet   0   . traMADol (ULTRAM) 50 MG tablet   Oral   Take 1 tablet (50 mg total) by mouth every 6 (six) hours as needed for moderate pain.   30 tablet   0   . etonogestrel (IMPLANON) 68 MG IMPL implant   Subcutaneous   Inject 1 each into the skin continuous.          BP 107/71  Pulse 54  Temp(Src) 97.7 F (36.5 C) (Oral)  Resp 20  SpO2 97% Physical Exam  Nursing note and vitals reviewed. Constitutional: She is oriented to person, place, and time. She appears well-developed and well-nourished. She appears distressed (uncomfortable appearing).  HENT:  Head: Normocephalic and atraumatic.  Nose: Nose normal.  Mouth/Throat: Oropharynx is clear and moist.  Eyes: Conjunctivae and EOM are normal. Pupils are equal, round, and reactive to light.  Neck: Normal range of motion. Neck  supple. No JVD present. No tracheal deviation present. No thyromegaly present.  Cardiovascular: Normal rate, regular rhythm, normal heart sounds and intact distal pulses.  Exam reveals no gallop and no friction rub.   No murmur heard. Pulmonary/Chest: Effort normal and breath sounds normal. No stridor. No respiratory distress. She has no wheezes. She has no rales. She exhibits no tenderness.  Abdominal: Soft. She exhibits no distension and no mass. There is tenderness (diffuse tenderness). There is no rebound and no guarding.  Decreased bowel sounds  Musculoskeletal: Normal range of motion. She exhibits no edema and no tenderness.  Lymphadenopathy:    She has no cervical adenopathy.  Neurological: She is alert and oriented to person, place, and time. She exhibits normal muscle tone. Coordination normal.  Skin: Skin is warm and dry. No rash noted. No erythema.  No pallor.  Psychiatric: She has a normal mood and affect. Her behavior is normal. Judgment and thought content normal.    ED Course  Procedures (including critical care time) Labs Review Labs Reviewed  CBC WITH DIFFERENTIAL - Abnormal; Notable for the following:    RBC 5.20 (*)    All other components within normal limits  COMPREHENSIVE METABOLIC PANEL - Abnormal; Notable for the following:    Glucose, Bld 156 (*)    All other components within normal limits  LIPASE, BLOOD   Imaging Review No results found.  EKG Interpretation   None       MDM   1. Abdominal pain   2. Nausea and vomiting    22 yo female with persistent abd pain, n/v now.  Will plan to repeat labs, give ivf, pain/nausea medications.    3:27 AM Repeat labs again reassuring.  Pt has been able to keep down cracker and gingerale.  Will d/c home with pepcid, carafate, nausea medications.   Olivia Mackie, MD 01/22/13 [phone]

## 2013-01-22 NOTE — ED Notes (Signed)
Was seen in [facility] last night and [facility] earlier yesterday for same c/o stomach pain.  Reports being treated for UTI and given tramadol for pain but it is not working.

## 2013-04-27 ENCOUNTER — Inpatient Hospital Stay (HOSPITAL_COMMUNITY)
Admission: AD | Admit: 2013-04-27 | Discharge: 2013-04-27 | Disposition: A | Discharge status: Home / Self Care | Payer: BC Managed Care – PPO | Source: Ambulatory Visit | Attending: Obstetrics & Gynecology | Admitting: Obstetrics & Gynecology

## 2013-04-27 ENCOUNTER — Encounter (HOSPITAL_COMMUNITY): Payer: Self-pay | Admitting: *Deleted

## 2013-04-27 DIAGNOSIS — Z3202 Encounter for pregnancy test, result negative: Secondary | ICD-10-CM

## 2013-04-27 LAB — HCG, QUANTITATIVE, PREGNANCY: hCG, Beta Chain, Quant, S: 1 m[IU]/mL (ref <5)

## 2013-04-27 LAB — URINALYSIS, ROUTINE W REFLEX MICROSCOPIC
BILIRUBIN URINE: NEGATIVE
GLUCOSE, UA: NEGATIVE mg/dL
Ketones, ur: NEGATIVE mg/dL
Leukocytes, UA: NEGATIVE
Nitrite: NEGATIVE
Protein, ur: NEGATIVE mg/dL
Specific Gravity, Urine: >1.03 — ABNORMAL HIGH (ref 1.005–1.030)
Urobilinogen, UA: 0.2 mg/dL (ref 0.0–1.0)
pH: 6 (ref 5.0–8.0)

## 2013-04-27 LAB — URINE MICROSCOPIC-ADD ON

## 2013-04-27 NOTE — Discharge Instructions (Signed)
Oral Contraception Information  Oral contraceptive pills (OCPs) are medicines taken to prevent pregnancy. OCPs work by preventing the ovaries from releasing eggs. The hormones in OCPs also cause the cervical mucus to thicken, preventing the sperm from entering the uterus. The hormones also cause the uterine lining to become thin, not allowing a fertilized egg to attach to the inside of the uterus. OCPs are highly effective when taken exactly as prescribed. However, OCPs do not prevent sexually transmitted diseases (STDs). Safe sex practices, such as using condoms along with the pill, can help prevent STDs.   Before taking the pill, you may have a physical exam and Pap test. Your health care provider may order blood tests. The health care provider will make sure you are a good candidate for oral contraception. Discuss with your health care provider the possible side effects of the OCP you may be prescribed. When starting an OCP, it can take 2 to 3 months for the body to adjust to the changes in hormone levels in your body.   TYPES OF ORAL CONTRACEPTION  · The combination pill This pill contains estrogen and progestin (synthetic progesterone) hormones. The combination pill comes in 21-day, 28-day, or 91-day packs. Some types of combination pills are meant to be taken continuously (365-day pills). With 21-day packs, you do not take pills for 7 days after the last pill. With 28-day packs, the pill is taken every day. The last 7 pills are without hormones. Certain types of pills have more than 21 hormone-containing pills. With 91-day packs, the first 84 pills contain both hormones, and the last 7 pills contain no hormones or contain estrogen only.  · The minipill This pill contains the progesterone hormone only. The pill is taken every day continuously. It is very important to take the pill at the same time each day. The minipill comes in packs of 28 pills. All 28 pills contain the hormone.    ADVANTAGES OF ORAL  CONTRACEPTIVE PILLS  · Decreases premenstrual symptoms.    · Treats menstrual period cramps.    · Regulates the menstrual cycle.    · Decreases a heavy menstrual flow.    · May treat acne, depending on the type of pill.    · Treats abnormal uterine bleeding.    · Treats polycystic ovarian syndrome.    · Treats endometriosis.    · Can be used as emergency contraception.    THINGS THAT CAN MAKE ORAL CONTRACEPTIVE PILLS LESS EFFECTIVE  OCPs can be less effective if:   · You forget to take the pill at the same time every day.    · You have a stomach or intestinal disease that lessens the absorption of the pill.    · You take OCPs with other medicines that make OCPs less effective, such as antibiotics, certain HIV medicines, and some seizure medicines.    · You take expired OCPs.    · You forget to restart the pill on day 7, when using the packs of 21 pills.    RISKS ASSOCIATED WITH ORAL CONTRACEPTIVE PILLS   Oral contraceptive pills can sometimes cause side effects, such as:  · Headache.  · Nausea.  · Breast tenderness.  · Irregular bleeding or spotting.  Combination pills are also associated with a small increased risk of:  · Blood clots.  · Heart attack.  · Stroke.  Document Released: 04/28/2002 Document Revised: 11/26/2012 Document Reviewed: 07/27/2012  ExitCare® Patient Information ©2014 [employer].

## 2013-04-27 NOTE — MAU Note (Signed)
Pt +UPT at home, unsure of LMP. Recently had implanon removed. Denies pain or bleeding.

## 2013-04-27 NOTE — MAU Provider Note (Signed)
History     CSN: [id number]  Arrival date and time: 04/27/13 2008   First Provider Initiated Contact with Patient 04/27/13 2058      Chief Complaint  Patient presents with  . Possible Pregnancy   HPI [name] is a 23 y.o. G0P0 who presents to MAU today for confirmation of pregnancy. The patient reports +HPT today. She denies abdominal pain, vaginal bleeding, discharge, N/V or fever. She is unsure of LMP. Implanon was removed ~ 1 month ago. [facility] She has been taking those, but not at the same time every day.   OB History   Grav Para Term Preterm Abortions TAB SAB Ect Mult Living   0               Past Medical History  Diagnosis Date  . Degenerative disc disease   . STD (female)     Past Surgical History  Procedure Laterality Date  . Cholecystectomy      Family History  Problem Relation Age of Onset  . Colon cancer Neg Hx   . Cervical cancer Maternal Aunt     History  Substance Use Topics  . Smoking status: Never Smoker   . Smokeless tobacco: Never Used  . Alcohol Use: No    Allergies: No Known Allergies  Prescriptions prior to admission  Medication Sig Dispense Refill  . phentermine 15 MG capsule Take 15 mg by mouth daily.        Review of Systems  Constitutional: Negative for fever and malaise/fatigue.  Gastrointestinal: Negative for nausea, vomiting, abdominal pain, diarrhea and constipation.  Genitourinary: Negative for dysuria, urgency and frequency.       Neg - vaginal bleeding, discharge   Physical Exam   Blood pressure 127/72, pulse 82, temperature 98 F (36.7 C), temperature source Oral, resp. rate 16, height 5\' 6"  (1.676 m), weight 222 lb 12.8 oz (101.061 kg).  Physical Exam  Constitutional: She is oriented to person, place, and time. She appears well-developed and well-nourished. No distress.  HENT:  Head: Normocephalic and atraumatic.   Cardiovascular: Normal rate.   Respiratory: Effort normal.  GI: Soft. Bowel sounds are normal. She exhibits no distension and no mass. There is no tenderness. There is no rebound and no guarding.  Neurological: She is alert and oriented to person, place, and time.  Skin: Skin is warm and dry. No erythema.  Psychiatric: She has a normal mood and affect.   Results for orders placed during the hospital encounter of 04/27/13 (from the past 24 hour(s))  URINALYSIS, ROUTINE W REFLEX MICROSCOPIC     Status: Abnormal   Collection Time    04/27/13  8:30 PM      Result Value Ref Range   Color, Urine YELLOW  YELLOW   APPearance CLEAR  CLEAR   Specific Gravity, Urine >1.030 (*) 1.005 - 1.030   pH 6.0  5.0 - 8.0   Glucose, UA NEGATIVE  NEGATIVE mg/dL   Hgb urine dipstick TRACE (*) NEGATIVE   Bilirubin Urine NEGATIVE  NEGATIVE   Ketones, ur NEGATIVE  NEGATIVE mg/dL   Protein, ur NEGATIVE  NEGATIVE mg/dL   Urobilinogen, UA 0.2  0.0 - 1.0 mg/dL   Nitrite NEGATIVE  NEGATIVE   Leukocytes, UA NEGATIVE  NEGATIVE  URINE MICROSCOPIC-ADD ON     Status: None   Collection Time    04/27/13  8:30 PM      Result  Value Ref Range   Squamous Epithelial / LPF RARE  RARE   WBC, UA 0-2  <3 WBC/hpf   RBC / HPF 0-2  <3 RBC/hpf   Bacteria, UA RARE  RARE   Urine-Other MUCOUS PRESENT    HCG, QUANTITATIVE, PREGNANCY     Status: None   Collection Time    04/27/13  9:18 PM      Result Value Ref Range   hCG, Beta Chain, Quant, S 1  <5 mIU/mL    MAU Course  Procedures None  MDM UPT - negative  Quant hCG ordered to confirm  Assessment and Plan  A: Negative pregnancy test  P: Discharge home Patient advised to continue OCPs and take at the same time daily Patient advised to use a back-up method of birth control at least until she has complete a pack of OCPs correctly Patient given contact information for [facility] clinic to schedule first annual exam after she turns 23 years old Patient may return to MAU as  needed or if her condition were to change or worsen  Freddi Starr, PA-C  04/27/2013, 10:02 PM

## 2013-12-24 ENCOUNTER — Encounter (HOSPITAL_COMMUNITY): Payer: Self-pay | Admitting: *Deleted

## 2013-12-24 ENCOUNTER — Inpatient Hospital Stay (HOSPITAL_COMMUNITY)
Admission: AD | Admit: 2013-12-24 | Discharge: 2013-12-24 | Disposition: A | Discharge status: Home / Self Care | Payer: BC Managed Care – PPO | Source: Ambulatory Visit | Attending: Obstetrics and Gynecology | Admitting: Obstetrics and Gynecology

## 2013-12-24 DIAGNOSIS — Z793 Long term (current) use of hormonal contraceptives: Secondary | ICD-10-CM | POA: Insufficient documentation

## 2013-12-24 DIAGNOSIS — N760 Acute vaginitis: Secondary | ICD-10-CM | POA: Insufficient documentation

## 2013-12-24 DIAGNOSIS — B3731 Acute candidiasis of vulva and vagina: Secondary | ICD-10-CM

## 2013-12-24 DIAGNOSIS — B9689 Other specified bacterial agents as the cause of diseases classified elsewhere: Secondary | ICD-10-CM | POA: Insufficient documentation

## 2013-12-24 DIAGNOSIS — N912 Amenorrhea, unspecified: Secondary | ICD-10-CM | POA: Insufficient documentation

## 2013-12-24 DIAGNOSIS — A499 Bacterial infection, unspecified: Secondary | ICD-10-CM

## 2013-12-24 DIAGNOSIS — B373 Candidiasis of vulva and vagina: Secondary | ICD-10-CM

## 2013-12-24 DIAGNOSIS — N911 Secondary amenorrhea: Secondary | ICD-10-CM

## 2013-12-24 LAB — WET PREP, GENITAL
Trich, Wet Prep: NONE SEEN
Yeast Wet Prep HPF POC: NONE SEEN

## 2013-12-24 LAB — POCT PREGNANCY, URINE: Preg Test, Ur: NEGATIVE

## 2013-12-24 LAB — HIV ANTIBODY (ROUTINE TESTING W REFLEX): HIV: NONREACTIVE

## 2013-12-24 MED ORDER — METRONIDAZOLE 500 MG PO TABS: 500.0000 mg | ORAL_TABLET | Freq: Two times a day (BID) | ORAL | Status: DC

## 2013-12-24 MED ORDER — FLUCONAZOLE 150 MG PO TABS: 150.0000 mg | ORAL_TABLET | ORAL | Status: AC | PRN

## 2013-12-24 NOTE — Discharge Instructions (Signed)
Bacterial Vaginosis Bacterial vaginosis is a vaginal infection that occurs when the normal balance of bacteria in the vagina is disrupted. It results from an overgrowth of certain bacteria. This is the most common vaginal infection in women of childbearing age. Treatment is important to prevent complications, especially in pregnant women, as it can cause a premature delivery. CAUSES  Bacterial vaginosis is caused by an increase in harmful bacteria that are normally present in smaller amounts in the vagina. Several different kinds of bacteria can cause bacterial vaginosis. However, the reason that the condition develops is not fully understood. RISK FACTORS Certain activities or behaviors can put you at an increased risk of developing bacterial vaginosis, including:  Having a new sex partner or multiple sex partners.  Douching.  Using an intrauterine device (IUD) for contraception. Women do not get bacterial vaginosis from toilet seats, bedding, swimming pools, or contact with objects around them. SIGNS AND SYMPTOMS  Some women with bacterial vaginosis have no signs or symptoms. Common symptoms include:  Grey vaginal discharge.  A fishlike odor with discharge, especially after sexual intercourse.  Itching or burning of the vagina and vulva.  Burning or pain with urination. DIAGNOSIS  Your health care provider will take a medical history and examine the vagina for signs of bacterial vaginosis. A sample of vaginal fluid may be taken. Your health care provider will look at this sample under a microscope to check for bacteria and abnormal cells. A vaginal pH test may also be done.  TREATMENT  Bacterial vaginosis may be treated with antibiotic medicines. These may be given in the form of a pill or a vaginal cream. A second round of antibiotics may be prescribed if the condition comes back after treatment.  HOME CARE INSTRUCTIONS   Only take over-the-counter or prescription medicines as  directed by your health care provider.  If antibiotic medicine was prescribed, take it as directed. Make sure you finish it even if you start to feel better.  Do not have sex until treatment is completed.  Tell all sexual partners that you have a vaginal infection. They should see their health care provider and be treated if they have problems, such as a mild rash or itching.  Practice safe sex by using condoms and only having one sex partner. SEEK MEDICAL CARE IF:   Your symptoms are not improving after 3 days of treatment.  You have increased discharge or pain.  You have a fever. MAKE SURE YOU:   Understand these instructions.  Will watch your condition.  Will get help right away if you are not doing well or get worse. FOR MORE INFORMATION  Centers for Disease Control and Prevention, Division of STD Prevention: www.cdc.gov/std American Sexual Health Association (ASHA): www.ashastd.org  Document Released: 02/05/2005 Document Revised: 11/26/2012 Document Reviewed: 09/17/2012 ExitCare Patient Information 2015 ExitCare[employer] LLC. This information is not intended to replace advice given to you by your health care provider. Make sure you discuss any questions you have with your health care provider. Monilial Vaginitis Vaginitis in a soreness, swelling and redness (inflammation) of the vagina and vulva. Monilial vaginitis is not a sexually transmitted infection. CAUSES  Yeast vaginitis is caused by yeast (candida) that is normally found in your vagina. With a yeast infection, the candida has overgrown in number to a point that upsets the chemical balance. SYMPTOMS   White, thick vaginal discharge.  Swelling, itching, redness and irritation of the vagina and possibly the lips of the vagina (vulva).  Burning or painful   urination.  Painful intercourse. DIAGNOSIS  Things that may contribute to monilial vaginitis are:  Postmenopausal and virginal  states.  Pregnancy.  Infections.  Being tired, sick or stressed, especially if you had monilial vaginitis in the past.  Diabetes. Good control will help lower the chance.  Birth control pills.  Tight fitting garments.  Using bubble bath, feminine sprays, douches or deodorant tampons.  Taking certain medications that kill germs (antibiotics).  Sporadic recurrence can occur if you become ill. TREATMENT  Your caregiver will give you medication.  There are several kinds of anti monilial vaginal creams and suppositories specific for monilial vaginitis. For recurrent yeast infections, use a suppository or cream in the vagina 2 times a week, or as directed.  Anti-monilial or steroid cream for the itching or irritation of the vulva may also be used. Get your caregiver's permission.  Painting the vagina with methylene blue solution may help if the monilial cream does not work.  Eating yogurt may help prevent monilial vaginitis. HOME CARE INSTRUCTIONS   Finish all medication as prescribed.  Do not have sex until treatment is completed or after your caregiver tells you it is okay.  Take warm sitz baths.  Do not douche.  Do not use tampons, especially scented ones.  Wear cotton underwear.  Avoid tight pants and panty hose.  Tell your sexual partner that you have a yeast infection. They should go to their caregiver if they have symptoms such as mild rash or itching.  Your sexual partner should be treated as well if your infection is difficult to eliminate.  Practice safer sex. Use condoms.  Some vaginal medications cause latex condoms to fail. Vaginal medications that harm condoms are:  Cleocin cream.  Butoconazole (Femstat).  Terconazole (Terazol) vaginal suppository.  Miconazole (Monistat) (may be purchased over the counter). SEEK MEDICAL CARE IF:   You have a temperature by mouth above 102 F (38.9 C).  The infection is getting worse after 2 days of  treatment.  The infection is not getting better after 3 days of treatment.  You develop blisters in or around your vagina.  You develop vaginal bleeding, and it is not your menstrual period.  You have pain when you urinate.  You develop intestinal problems.  You have pain with sexual intercourse. Document Released: 11/15/2004 Document Revised: 04/30/2011 Document Reviewed: 07/30/2008 ExitCare Patient Information 2015 ExitCare, LLC. This information is not intended to replace advice given to you by your health care provider. Make sure you discuss any questions you have with your health care provider.  

## 2013-12-24 NOTE — MAU Note (Signed)
C/o irregular periods since January; implanon taken out in January; missed a period in October;

## 2013-12-24 NOTE — MAU Provider Note (Signed)
Chief Complaint: Possible Pregnancy and Vaginal Discharge   First Provider Initiated Contact with Patient 12/24/13 1308     SUBJECTIVE HPI: Regina Nelson is a [age] y.o. G0P0 female who presents with Irregular periods since January 2015, missed. In October and vaginal discharge 2 days. At Implanon taken out in January. Reports regular cycles before that. Patient reports being sexually active and taking birth control pills as directed. Denies significant weight changes, stressors or other likely causes of changes in the menstrual cycle  C/o irregular periods since January; implanon taken out in January; missed a period in October  Past Medical History  Diagnosis Date  . Degenerative disc disease   . STD (female)    OB History  Gravida Para Term Preterm AB SAB TAB Ectopic Multiple Living  0                Past Surgical History  Procedure Laterality Date  . Cholecystectomy     History   Social History  . Marital Status: Single    Spouse Name: N/A    Number of Children: 0  . Years of Education: N/A   Occupational History  . Medical Records Tech     Social History Main Topics  . Smoking status: Never Smoker   . Smokeless tobacco: Never Used  . Alcohol Use: No  . Drug Use: No  . Sexual Activity: Yes    Birth Control/ Protection: Pill   Other Topics Concern  . Not on file   Social History Narrative   Daily caffeine    No current facility-administered medications on file prior to encounter.   Current Outpatient Prescriptions on File Prior to Encounter  Medication Sig Dispense Refill  . phentermine 15 MG capsule Take 15 mg by mouth daily.     No Known Allergies  ROS: Pertinent items in HPI. Negative for fever, chills, abdominal pain, dyspareunia, intermenstrual bleeding, unexplained weight changes, changes in hair or nails, heat or cold intolerance.  OBJECTIVE Blood pressure 118/66, pulse 71, temperature 98.4 F (36.9 C), temperature source Oral, resp. rate  16, height 5' 6.5" (1.689 m), weight 97.251 kg (214 lb 6.4 oz), SpO2 99 %. GENERAL: Well-developed, well-nourished female in no acute distress.  HEENT: Normocephalic HEART: normal rate RESP: normal effort ABDOMEN: Soft, non-tender EXTREMITIES: Nontender, no edema NEURO: Alert and oriented SPECULUM EXAM: NEFG, moderate amount of a mixture of curd-like, white, curd-like discharge and thin, white malodorous discharge, no blood noted, cervix clean BIMANUAL: cervix closed; uterus normal size, no adnexal tenderness or masses. No cervical motion tenderness.  LAB RESULTS Results for orders placed or performed during the hospital encounter of 12/24/13 (from the past 24 hour(s))  Pregnancy, urine POC     Status: None   Collection Time: 12/24/13 12:31 PM  Result Value Ref Range   Preg Test, Ur NEGATIVE NEGATIVE  Wet prep, genital     Status: Abnormal   Collection Time: 12/24/13  1:20 PM  Result Value Ref Range   Yeast Wet Prep HPF POC NONE SEEN NONE SEEN   Trich, Wet Prep NONE SEEN NONE SEEN   Clue Cells Wet Prep HPF POC FEW (A) NONE SEEN   WBC, Wet Prep HPF POC FEW (A) NONE SEEN    IMAGING No results found.  MAU COURSE  ASSESSMENT 1. BV (bacterial vaginosis)   2. Vaginal yeast infection   3. Secondary amenorrhea     PLAN Discharge home in stable condition. Discussed possible causes of secondary amenorrhea. Discussed negative UPT  today, but that it can take up to 2 weeks after conception for pregnancy test to show positive result. Recommend taking home pregnancy test if no menstrual period Occurs in the next 2 weeks. Gonorrhea/Chlamydia cultures pending.     Follow-up Information    Follow up with Gynecologist.   Why:  As needed if symptoms worsen      [facility]   Why:  As needed in emergencies   Contact information:   [address] [id number] [place]c [place] [id number] [phone]        Medication List    TAKE these medications        fluconazole 150 MG tablet  Commonly known as:  DIFLUCAN  Take 1 tablet (150 mg total) by mouth every three (3) days as needed.     metroNIDAZOLE 500 MG tablet  Commonly known as:  FLAGYL  Take 1 tablet (500 mg total) by mouth 2 (two) times daily.     phentermine 15 MG capsule  Take 15 mg by mouth daily.        [place]in Swing, CNM 12/24/2013  2:44 PM

## 2013-12-24 NOTE — MAU Note (Signed)
Patient states she had an implant removed in January and has had irregular periods since that time. Has had a vaginal discharge for a couple of days. Denies pain or bleeding.

## 2013-12-25 LAB — GC/CHLAMYDIA PROBE AMP
CT Probe RNA: NEGATIVE
GC Probe RNA: NEGATIVE

## 2014-06-11 ENCOUNTER — Emergency Department (HOSPITAL_BASED_OUTPATIENT_CLINIC_OR_DEPARTMENT_OTHER)
Admission: EM | Admit: 2014-06-11 | Discharge: 2014-06-12 | Disposition: A | Discharge status: Home / Self Care | Payer: Self-pay | Attending: Emergency Medicine | Admitting: Emergency Medicine

## 2014-06-11 ENCOUNTER — Encounter (HOSPITAL_BASED_OUTPATIENT_CLINIC_OR_DEPARTMENT_OTHER): Payer: Self-pay | Admitting: Emergency Medicine

## 2014-06-11 DIAGNOSIS — Z792 Long term (current) use of antibiotics: Secondary | ICD-10-CM | POA: Insufficient documentation

## 2014-06-11 DIAGNOSIS — K219 Gastro-esophageal reflux disease without esophagitis: Secondary | ICD-10-CM | POA: Insufficient documentation

## 2014-06-11 DIAGNOSIS — Z9049 Acquired absence of other specified parts of digestive tract: Secondary | ICD-10-CM | POA: Insufficient documentation

## 2014-06-11 DIAGNOSIS — Z8619 Personal history of other infectious and parasitic diseases: Secondary | ICD-10-CM | POA: Insufficient documentation

## 2014-06-11 DIAGNOSIS — Z720 Tobacco use: Secondary | ICD-10-CM | POA: Insufficient documentation

## 2014-06-11 DIAGNOSIS — Z8739 Personal history of other diseases of the musculoskeletal system and connective tissue: Secondary | ICD-10-CM | POA: Insufficient documentation

## 2014-06-11 DIAGNOSIS — N72 Inflammatory disease of cervix uteri: Secondary | ICD-10-CM | POA: Insufficient documentation

## 2014-06-11 LAB — WET PREP, GENITAL
Trich, Wet Prep: NONE SEEN
Yeast Wet Prep HPF POC: NONE SEEN

## 2014-06-11 MED ORDER — AZITHROMYCIN 1 G PO PACK
1.0000 g | PACK | Freq: Once | ORAL | Status: AC
  Administered 2014-06-12: 1 g via ORAL
  Filled 2014-06-11: qty 1

## 2014-06-11 MED ORDER — CEFTRIAXONE SODIUM 250 MG IJ SOLR
250.0000 mg | Freq: Once | INTRAMUSCULAR | Status: AC
  Administered 2014-06-12: 250 mg via INTRAMUSCULAR
  Filled 2014-06-11: qty 250

## 2014-06-11 MED ORDER — GI COCKTAIL ~~LOC~~
30.0000 mL | Freq: Once | ORAL | Status: AC
  Administered 2014-06-12: 30 mL via ORAL
  Filled 2014-06-11: qty 30

## 2014-06-11 NOTE — ED Notes (Signed)
Patient attempted to provide urine sample, was unable to do so at this time.

## 2014-06-11 NOTE — ED Notes (Addendum)
Patient c/o upper abd pain, for @ 1 week. Patient states she had vomiting x2 earlier in the week, denies diarrhea, denies nausea. Last BM 06/10/2014, normal. Patient states she began having a sexual relationship with someone around the same time her pain started, states this is similar to when she had an STD in the past.

## 2014-06-11 NOTE — ED Provider Notes (Signed)
CSN: [id number]     Arrival date & time 06/11/14  2259 History  This chart was scribed for Regina Burnett, MD by Roxy Cedar, ED Scribe. This patient was seen in room MH06/MH06 and the patient's care was started at 11:27 PM.   Chief Complaint  Patient presents with  . Abdominal Pain    upper   Patient is a 24 y.o. female presenting with abdominal pain. The history is provided by the patient. No language interpreter was used.  Abdominal Pain Pain location:  Epigastric and suprapubic Pain quality: aching and cramping   Pain radiates to:  Does not radiate Pain severity:  Moderate Onset quality:  Gradual Duration:  1 week Timing:  Intermittent Progression:  Unchanged Chronicity:  Recurrent Context: not alcohol use and not trauma   Relieved by:  Nothing Worsened by:  Nothing tried Ineffective treatments:  None tried Associated symptoms: no dysuria and no vaginal discharge   Risk factors: no alcohol abuse    HPI Comments: Regina Nelson is a 24 y.o. female with a PMHx of DDD, cholecystectomy, and STD, who presents to the Emergency Department complaining of moderate, intermittent epigastric pain onset 1 week ago, with pain worsening at night. She reports associated urinary frequency. She denies associated dysuria, constipation, vaginal discharge. Patient had implant removed over 1 year ago. LNMP was 05/02/14. Patient states that the last time she had similar symptoms, she was diagnosed with STD. She states that she has had the same partner since then, but reports that they broke up for 1 week. Patient's last bowel movement was a few days ago.   Past Medical History  Diagnosis Date  . Degenerative disc disease   . STD (female)    Past Surgical History  Procedure Laterality Date  . Cholecystectomy     Family History  Problem Relation Age of Onset  . Colon cancer Neg Hx   . Cervical cancer Maternal Aunt    History  Substance Use Topics  . Smoking status: Current Every  Day Smoker -- 0.30 packs/day    Types: Cigarettes  . Smokeless tobacco: Never Used  . Alcohol Use: No     Comment: socially   OB History    Gravida Para Term Preterm AB TAB SAB Ectopic Multiple Living   0              Review of Systems  Gastrointestinal: Positive for abdominal pain.  Genitourinary: Positive for frequency. Negative for dysuria and vaginal discharge.  All other systems reviewed and are negative.  Allergies  Review of patient's allergies indicates no known allergies.  Home Medications   Prior to Admission medications   Medication Sig Start Date End Date Taking? Authorizing Provider  metroNIDAZOLE (FLAGYL) 500 MG tablet Take 1 tablet (500 mg total) by mouth 2 (two) times daily. 12/24/13   Dorathy Kinsman, CNM  phentermine 15 MG capsule Take 15 mg by mouth daily.    Historical Provider, MD   Triage Vitals: BP 122/63 mmHg  Pulse 105  Temp(Src) 99.1 F (37.3 C) (Oral)  Resp 18  Ht  (1.676 m)  Wt 215 lb (97.523 kg)  BMI 34.72 kg/m2  SpO2 98%  LMP 05/02/2014 (Exact Date)  Physical Exam  Constitutional: She is oriented to person, place, and time. She appears well-developed and well-nourished. No distress.  HENT:  Head: Normocephalic and atraumatic.  Mouth/Throat: Oropharynx is clear and moist. No oropharyngeal exudate.  Eyes: Pupils are equal, round, and reactive to light.  Neck: Normal  range of motion. Neck supple.  Cardiovascular: Normal rate, regular rhythm and normal heart sounds.   Pulmonary/Chest: Effort normal and breath sounds normal. No respiratory distress. She has no wheezes. She has no rales.  Abdominal: Soft. There is no tenderness. There is no rebound and no guarding.  Hyperactive bowel sounds throughout. Stool palpable throughout colon.  Genitourinary: Vaginal discharge found.  Positive CMT chaperone present  Musculoskeletal: Normal range of motion.  Neurological: She is alert and oriented to person, place, and time. No cranial nerve  deficit. Coordination normal.  Skin: Skin is warm and dry. No rash noted. She is not diaphoretic.  Psychiatric: She has a normal mood and affect. Her behavior is normal.  Nursing note and vitals reviewed.  ED Course  Procedures (including critical care time)  DIAGNOSTIC STUDIES: Oxygen Saturation is 98% on RA, normal by my interpretation.    COORDINATION OF CARE: 11:30 PM- Discussed plans to order diagnotic lab work. Will give patient Rocephin, Zithromax, and GI cocktail. Pt advised of plan for treatment and pt agrees.  Labs Review Labs Reviewed  CBC WITH DIFFERENTIAL/PLATELET  COMPREHENSIVE METABOLIC PANEL  LIPASE, BLOOD  PREGNANCY, URINE  URINALYSIS, ROUTINE W REFLEX MICROSCOPIC   Imaging Review No results found.   EKG Interpretation None     MDM   Final diagnoses:  None    Given h/o STI and CMT on exam will treat.  Suspect upper abdominal pain is actually GERD will treat for same.  [facility] for recheck.  No sexual activity until 7 days after all partners treated.    I personally performed the services described in this documentation, which was scribed in my presence. The recorded information has been reviewed and is accurate.    Cy BlamerApril Shaday Rayborn, MD 06/12/14 [phone]

## 2014-06-12 ENCOUNTER — Encounter (HOSPITAL_BASED_OUTPATIENT_CLINIC_OR_DEPARTMENT_OTHER): Payer: Self-pay | Admitting: Emergency Medicine

## 2014-06-12 LAB — URINALYSIS, ROUTINE W REFLEX MICROSCOPIC
Bilirubin Urine: NEGATIVE
Glucose, UA: NEGATIVE mg/dL
Hgb urine dipstick: NEGATIVE
Ketones, ur: NEGATIVE mg/dL
LEUKOCYTES UA: NEGATIVE
NITRITE: NEGATIVE
PROTEIN: NEGATIVE mg/dL
Specific Gravity, Urine: 1.028 (ref 1.005–1.030)
Urobilinogen, UA: 1 mg/dL (ref 0.0–1.0)
pH: 7 (ref 5.0–8.0)

## 2014-06-12 LAB — PREGNANCY, URINE: Preg Test, Ur: NEGATIVE

## 2014-06-12 MED ORDER — DOXYCYCLINE HYCLATE 100 MG PO CAPS: 100.0000 mg | ORAL_CAPSULE | Freq: Two times a day (BID) | ORAL | Status: DC

## 2014-06-12 MED ORDER — OMEPRAZOLE 20 MG PO CPDR: 20.0000 mg | DELAYED_RELEASE_CAPSULE | Freq: Every day | ORAL | Status: DC

## 2014-06-12 MED ORDER — LIDOCAINE HCL (PF) 1 % IJ SOLN
INTRAMUSCULAR | Status: AC
  Administered 2014-06-12: 1.5 mL
  Filled 2014-06-12: qty 5

## 2014-06-12 NOTE — Discharge Instructions (Signed)
Cervicitis °Cervicitis is a soreness and puffiness (inflammation) of the cervix.  °HOME CARE °· Do not have sex (intercourse) until your doctor says it is okay. °· Do not have sex until your partner is treated or as told by your doctor. °· Take your antibiotic medicine as told. Finish it even if you start to feel better. °GET HELP IF:  °· Your symptoms that brought you to the doctor come back. °· You have a fever. °MAKE SURE YOU:  °· Understand these instructions. °· Will watch your condition. °· Will get help right away if you are not doing well or get worse. °Document Released: 11/15/2007 Document Revised: 02/10/2013 Document Reviewed: 07/30/2012 °ExitCare® Patient Information ©2015 [employer]. This information is not intended to replace advice given to you by your health care provider. Make sure you discuss any questions you have with your health care provider. ° °

## 2014-06-12 NOTE — ED Notes (Signed)
C/o abd pain x 5 days w nausea x 2 days,  Denies vag dc or buring w urination

## 2014-06-15 LAB — GC/CHLAMYDIA PROBE AMP (~~LOC~~) NOT AT ARMC
Chlamydia: POSITIVE — AB
Neisseria Gonorrhea: POSITIVE — AB

## 2014-06-16 ENCOUNTER — Telehealth (HOSPITAL_BASED_OUTPATIENT_CLINIC_OR_DEPARTMENT_OTHER): Payer: Self-pay | Admitting: Emergency Medicine

## 2014-06-19 ENCOUNTER — Telehealth (HOSPITAL_BASED_OUTPATIENT_CLINIC_OR_DEPARTMENT_OTHER): Payer: Self-pay | Admitting: *Deleted

## 2014-10-01 ENCOUNTER — Inpatient Hospital Stay (HOSPITAL_COMMUNITY): Payer: Medicaid Other

## 2014-10-01 ENCOUNTER — Inpatient Hospital Stay (HOSPITAL_COMMUNITY)
Admission: AD | Admit: 2014-10-01 | Discharge: 2014-10-01 | Disposition: A | Discharge status: Home / Self Care | Payer: Medicaid Other | Source: Ambulatory Visit | Attending: Family Medicine | Admitting: Family Medicine

## 2014-10-01 ENCOUNTER — Encounter (HOSPITAL_COMMUNITY): Payer: Self-pay | Admitting: *Deleted

## 2014-10-01 DIAGNOSIS — R109 Unspecified abdominal pain: Secondary | ICD-10-CM | POA: Diagnosis not present

## 2014-10-01 DIAGNOSIS — F17210 Nicotine dependence, cigarettes, uncomplicated: Secondary | ICD-10-CM | POA: Insufficient documentation

## 2014-10-01 DIAGNOSIS — O26899 Other specified pregnancy related conditions, unspecified trimester: Secondary | ICD-10-CM | POA: Insufficient documentation

## 2014-10-01 DIAGNOSIS — O3680X0 Pregnancy with inconclusive fetal viability, not applicable or unspecified: Secondary | ICD-10-CM

## 2014-10-01 DIAGNOSIS — O9989 Other specified diseases and conditions complicating pregnancy, childbirth and the puerperium: Secondary | ICD-10-CM

## 2014-10-01 HISTORY — DX: Spondylosis, unspecified: M47.9

## 2014-10-01 LAB — CBC
HCT: 44.1 % (ref 36.0–46.0)
HEMOGLOBIN: 14.9 g/dL (ref 12.0–15.0)
MCH: 28.1 pg (ref 26.0–34.0)
MCHC: 33.8 g/dL (ref 30.0–36.0)
MCV: 83.1 fL (ref 78.0–100.0)
Platelets: 214 10*3/uL (ref 150–400)
RBC: 5.31 MIL/uL — ABNORMAL HIGH (ref 3.87–5.11)
RDW: 13.5 % (ref 11.5–15.5)
WBC: 10 10*3/uL (ref 4.0–10.5)

## 2014-10-01 LAB — URINALYSIS, ROUTINE W REFLEX MICROSCOPIC
BILIRUBIN URINE: NEGATIVE
Glucose, UA: NEGATIVE mg/dL
Hgb urine dipstick: NEGATIVE
Ketones, ur: 15 mg/dL — AB
Leukocytes, UA: NEGATIVE
Nitrite: NEGATIVE
PROTEIN: NEGATIVE mg/dL
Specific Gravity, Urine: 1.02 (ref 1.005–1.030)
UROBILINOGEN UA: 2 mg/dL — AB (ref 0.0–1.0)
pH: 6.5 (ref 5.0–8.0)

## 2014-10-01 LAB — WET PREP, GENITAL
Trich, Wet Prep: NONE SEEN
Yeast Wet Prep HPF POC: NONE SEEN

## 2014-10-01 LAB — ABO/RH: ABO/RH(D): O POS

## 2014-10-01 LAB — HCG, QUANTITATIVE, PREGNANCY: hCG, Beta Chain, Quant, S: 34 m[IU]/mL — ABNORMAL HIGH (ref <5)

## 2014-10-01 LAB — POCT PREGNANCY, URINE: Preg Test, Ur: POSITIVE — AB

## 2014-10-01 NOTE — Discharge Instructions (Signed)

## 2014-10-01 NOTE — MAU Provider Note (Signed)
Chief Complaint: Abdominal Pain and Possible Pregnancy   First Provider Initiated Contact with Patient 10/01/14 1647      SUBJECTIVE HPI: Regina Nelson is a 24 y.o. G1P0 at Unknown by LMP who presents to maternity admissions reporting abdominal cramping described as achy, on both sides of her lower abdomen, with onset 3 days ago.  She had positive pregnancy test this week.  She also reports she usually has BM at least 1-2x/day but has not gone in 3 days.  She denies vaginal bleeding, vaginal itching/burning, urinary symptoms, h/a, dizziness, n/v, or fever/chills.     Abdominal Pain This is a new problem. The current episode started in the past 7 days. The onset quality is sudden. The problem occurs intermittently. The problem has been unchanged. The pain is located in the LLQ and RLQ. The pain is moderate. The quality of the pain is aching. The abdominal pain does not radiate. Associated symptoms include constipation. Pertinent negatives include no diarrhea, dysuria, fever, frequency, headaches, nausea or vomiting. Nothing aggravates the pain. She has tried nothing for the symptoms.  Possible Pregnancy Associated symptoms include abdominal pain. Pertinent negatives include no chest pain, chills, fatigue, fever, headaches, nausea, neck pain, vomiting or weakness.    Past Medical History  Diagnosis Date  . Degenerative disc disease   . STD (female)   . Degenerative joint disease of spine    Past Surgical History  Procedure Laterality Date  . Cholecystectomy     Social History   Social History  . Marital Status: Single    Spouse Name: N/A  . Number of Children: 0  . Years of Education: N/A   Occupational History  . Medical Records Tech     Social History Main Topics  . Smoking status: Current Every Day Smoker -- 0.30 packs/day    Types: Cigarettes  . Smokeless tobacco: Never Used  . Alcohol Use: No     Comment: socially  . Drug Use: No  . Sexual Activity: Yes   Birth Control/ Protection: None   Other Topics Concern  . Not on file   Social History Narrative   Daily caffeine    No current facility-administered medications on file prior to encounter.   Current Outpatient Prescriptions on File Prior to Encounter  Medication Sig Dispense Refill  . doxycycline (VIBRAMYCIN) 100 MG capsule Take 1 capsule (100 mg total) by mouth 2 (two) times daily. (Patient not taking: Reported on 10/01/2014) 28 capsule 0  . metroNIDAZOLE (FLAGYL) 500 MG tablet Take 1 tablet (500 mg total) by mouth 2 (two) times daily. (Patient not taking: Reported on 10/01/2014) 14 tablet 0  . omeprazole (PRILOSEC) 20 MG capsule Take 1 capsule (20 mg total) by mouth daily. (Patient not taking: Reported on 10/01/2014) 30 capsule 0   Allergies  Allergen Reactions  . Bee Venom Anaphylaxis    ROS:  Review of Systems  Constitutional: Negative for fever, chills and fatigue.  HENT: Negative for sinus pressure.   Eyes: Negative for photophobia.  Respiratory: Negative for shortness of breath.   Cardiovascular: Negative for chest pain.  Gastrointestinal: Positive for abdominal pain and constipation. Negative for nausea, vomiting and diarrhea.  Genitourinary: Negative for dysuria, frequency, flank pain, vaginal bleeding, vaginal discharge, difficulty urinating, vaginal pain and pelvic pain.  Musculoskeletal: Negative for neck pain.  Neurological: Negative for dizziness, weakness and headaches.  Psychiatric/Behavioral: Negative.      I have reviewed patient's Past Medical Hx, Surgical Hx, Family Hx, Social Hx, medications and allergies.  Physical Exam   Patient Vitals for the past 24 hrs:  BP Temp Temp src Pulse Resp Height Weight  10/01/14 1306 120/71 mmHg 98.5 F (36.9 C) Oral 76 18 5' 4.5" (1.638 m) 96.072 kg (211 lb 12.8 oz)   Constitutional: Well-developed, well-nourished female in no acute distress.  Cardiovascular: normal rate Respiratory: normal effort GI: Abd soft,  tender in mid abdomen to right and left of umbilicus, Pos BS x 4 MS: Extremities nontender, no edema, normal ROM Neurologic: Alert and oriented x 4.  GU: Neg CVAT.  PELVIC EXAM: Cervix pink, visually closed, without lesion, scant white creamy discharge, vaginal walls and external genitalia normal Bimanual exam: Cervix 0/long/high, firm, anterior, neg CMT, uterus nontender, nonenlarged, adnexa without tenderness, enlargement, or mass   LAB RESULTS Results for orders placed or performed during the hospital encounter of 10/01/14 (from the past 24 hour(s))  Urinalysis, Routine w reflex microscopic (not at [facility])     Status: Abnormal   Collection Time: 10/01/14  1:10 PM  Result Value Ref Range   Color, Urine YELLOW YELLOW   APPearance CLEAR CLEAR   Specific Gravity, Urine 1.020 1.005 - 1.030   pH 6.5 5.0 - 8.0   Glucose, UA NEGATIVE NEGATIVE mg/dL   Hgb urine dipstick NEGATIVE NEGATIVE   Bilirubin Urine NEGATIVE NEGATIVE   Ketones, ur 15 (A) NEGATIVE mg/dL   Protein, ur NEGATIVE NEGATIVE mg/dL   Urobilinogen, UA 2.0 (H) 0.0 - 1.0 mg/dL   Nitrite NEGATIVE NEGATIVE   Leukocytes, UA NEGATIVE NEGATIVE  Pregnancy, urine POC     Status: Abnormal   Collection Time: 10/01/14  1:34 PM  Result Value Ref Range   Preg Test, Ur POSITIVE (A) NEGATIVE  CBC     Status: Abnormal   Collection Time: 10/01/14  2:26 PM  Result Value Ref Range   WBC 10.0 4.0 - 10.5 K/uL   RBC 5.31 (H) 3.87 - 5.11 MIL/uL   Hemoglobin 14.9 12.0 - 15.0 g/dL   HCT 45.4 09.8 - 11.9 %   MCV 83.1 78.0 - 100.0 fL   MCH 28.1 26.0 - 34.0 pg   MCHC 33.8 30.0 - 36.0 g/dL   RDW 14.7 82.9 - 56.2 %   Platelets 214 150 - 400 K/uL  ABO/Rh     Status: None (Preliminary result)   Collection Time: 10/01/14  2:26 PM  Result Value Ref Range   ABO/RH(D) O POS   hCG, quantitative, pregnancy     Status: Abnormal   Collection Time: 10/01/14  2:26 PM  Result Value Ref Range   hCG, Beta Chain, Quant, S 34 (H) <5 mIU/mL  Wet prep, genital      Status: Abnormal   Collection Time: 10/01/14  4:55 PM  Result Value Ref Range   Yeast Wet Prep HPF POC NONE SEEN NONE SEEN   Trich, Wet Prep NONE SEEN NONE SEEN   Clue Cells Wet Prep HPF POC FEW (A) NONE SEEN   WBC, Wet Prep HPF POC MANY (A) NONE SEEN    --/--/O POS (08/12 1426)  IMAGING US Ob Comp Less 14 Wks  10/01/2014   CLINICAL DATA:  Abdominal pain. Irregular cycles. Obesity. Quantitative beta HCG is 34.  EXAM: OBSTETRIC <14 WK [place] AND TRANSVAGINAL OB US  TECHNIQUE: Both transabdominal and transvaginal ultrasound examinations were performed for complete evaluation of the gestation as well as the maternal uterus, adnexal regions, and pelvic cul-de-sac. Transvaginal technique was performed to assess early pregnancy.  COMPARISON:  None applicable  FINDINGS: Intrauterine gestational sac: None seen  Yolk sac:  None seen  Embryo:  None seen  Maternal uterus/adnexae: Right corpus luteum cyst is 1.7 x 1.5 x 1.4 cm. Left ovary has a normal appearance. Mildly thickened endometrium. No free pelvic fluid.  IMPRESSION: 1. No intrauterine or ectopic pregnancy identified. 2. Serial quantitative beta HCG values and follow-up ultrasound are recommended as appropriate to document progression of and location of pregnancy. Ectopic pregnancy has not been excluded.   Electronically Signed   By: Norva Pavlov M.D.   On: 10/01/2014 17:41   US Ob Transvaginal  10/01/2014   CLINICAL DATA:  Abdominal pain. Irregular cycles. Obesity. Quantitative beta HCG is 34.  EXAM: OBSTETRIC <14 WK [place] AND TRANSVAGINAL OB US  TECHNIQUE: Both transabdominal and transvaginal ultrasound examinations were performed for complete evaluation of the gestation as well as the maternal uterus, adnexal regions, and pelvic cul-de-sac. Transvaginal technique was performed to assess early pregnancy.  COMPARISON:  None applicable  FINDINGS: Intrauterine gestational sac: None seen  Yolk sac:  None seen  Embryo:  None seen  Maternal uterus/adnexae:  Right corpus luteum cyst is 1.7 x 1.5 x 1.4 cm. Left ovary has a normal appearance. Mildly thickened endometrium. No free pelvic fluid.  IMPRESSION: 1. No intrauterine or ectopic pregnancy identified. 2. Serial quantitative beta HCG values and follow-up ultrasound are recommended as appropriate to document progression of and location of pregnancy. Ectopic pregnancy has not been excluded.   Electronically Signed   By: Norva Pavlov M.D.   On: 10/01/2014 17:41    MAU Management: Ordered and reviewed labs.  Pt stable at time of discharge.  ASSESSMENT 1. Pregnancy of unknown anatomic location   2. Abdominal pain affecting pregnancy     PLAN Discharge home with ectopic precautions Return to MAU for repeat quant hcg in 48 hours or sooner for emergencies  Follow-up Information    Follow up with [facility] [place] MATERN[facility] ADMISS[facility].   Why:  In 48 hours for repeat labs or sooner as needed   Contact information:   [address] [id number] mc [place] [address] [phone]      Sharen Counter Certified Nurse-Midwife 10/01/2014  6:12 PM

## 2014-10-01 NOTE — MAU Note (Signed)
abd is cramping, really really bad, started about 2-3 days ago.   +HPT 4 days ago and again last night.

## 2014-10-02 LAB — HIV ANTIBODY (ROUTINE TESTING W REFLEX): HIV SCREEN 4TH GENERATION: NONREACTIVE

## 2014-10-03 ENCOUNTER — Inpatient Hospital Stay (HOSPITAL_COMMUNITY)
Admission: AD | Admit: 2014-10-03 | Discharge: 2014-10-03 | Disposition: A | Discharge status: Home / Self Care | Payer: Medicaid Other | Source: Ambulatory Visit | Attending: Obstetrics & Gynecology | Admitting: Obstetrics & Gynecology

## 2014-10-03 DIAGNOSIS — O9989 Other specified diseases and conditions complicating pregnancy, childbirth and the puerperium: Secondary | ICD-10-CM | POA: Insufficient documentation

## 2014-10-03 DIAGNOSIS — O3680X0 Pregnancy with inconclusive fetal viability, not applicable or unspecified: Secondary | ICD-10-CM

## 2014-10-03 DIAGNOSIS — O0281 Inappropriate change in quantitative human chorionic gonadotropin (hCG) in early pregnancy: Secondary | ICD-10-CM

## 2014-10-03 LAB — HCG, QUANTITATIVE, PREGNANCY: HCG, BETA CHAIN, QUANT, S: 70 m[IU]/mL — AB (ref <5)

## 2014-10-03 NOTE — MAU Note (Signed)
Pt presents to MAU for repeat labs BHCG. Pt states she has no idea when her last menstrual cycle was. Denies any vaginal bleeding or pain

## 2014-10-03 NOTE — MAU Provider Note (Signed)
Ms. Regina Nelson  is a 24 y.o. G1P0 at Unknown who presents to MAU today for follow-up quant hCG after 48 hours. The patient denies abdominal pain, bleeding or fever today.   BP 117/68 mmHg  Pulse 81  Temp(Src) 98.2 F (36.8 C)  Resp 18  LMP  (LMP Unknown)  CONSTITUTIONAL: Well-developed, well-nourished female in no acute distress.  ENT: External right and left ear normal.  EYES: EOM intact, conjunctivae normal.  MUSCULOSKELETAL: Normal range of motion.  CARDIOVASCULAR: Regular heart rate RESPIRATORY: Normal effort NEUROLOGICAL: Alert and oriented to person, place, and time.  SKIN: Skin is warm and dry. No rash noted. Not diaphoretic. No erythema. No pallor. PSYCH: Normal mood and affect. Normal behavior. Normal judgment and thought content.  Results for [name], [name] [facility] (MRN [id number]) as of 10/03/2014 14:09  Ref. Range 10/01/2014 14:26 10/01/2014 16:55 10/01/2014 17:36 10/03/2014 13:10  HCG, Beta Chain, Quant, S Latest Ref Range: <5 mIU/mL 34 (H)   70 (H)   A: Appropriate rise in quant hCG after 48 hours  P: Discharge home First trimester/ectopic precautions discussed Patient will return for follow-up US in 1 week. Order placed. They will call the patient with an appointment time Patient may return to MAU as needed or if her condition were to change or worsen   Marny Lowenstein, PA-C 10/03/2014 2:09 PM

## 2014-10-03 NOTE — Discharge Instructions (Signed)
First Trimester of Pregnancy °The first trimester of pregnancy is from week 1 until the end of week 12 (months 1 through 3). During this time, your baby will begin to develop inside you. At 6-8 weeks, the eyes and face are formed, and the heartbeat can be seen on ultrasound. At the end of 12 weeks, all the baby's organs are formed. Prenatal care is all the medical care you receive before the birth of your baby. Make sure you get good prenatal care and follow all of your doctor's instructions. °HOME CARE  °Medicines °· Take medicine only as told by your doctor. Some medicines are safe and some are not during pregnancy. °· Take your prenatal vitamins as told by your doctor. °· Take medicine that helps you poop (stool softener) as needed if your doctor says it is okay. °Diet °· Eat regular, healthy meals. °· Your doctor will tell you the amount of weight gain that is right for you. °· Avoid raw meat and uncooked cheese. °· If you feel sick to your stomach (nauseous) or throw up (vomit): °¨ Eat 4 or 5 small meals a day instead of 3 large meals. °¨ Try eating a few soda crackers. °¨ Drink liquids between meals instead of during meals. °· If you have a hard time pooping (constipation): °¨ Eat high-fiber foods like fresh vegetables, fruit, and whole grains. °¨ Drink enough fluids to keep your pee (urine) clear or pale yellow. °Activity and Exercise °· Exercise only as told by your doctor. Stop exercising if you have cramps or pain in your lower belly (abdomen) or low back. °· Try to avoid standing for long periods of time. Move your legs often if you must stand in one place for a long time. °· Avoid heavy lifting. °· Wear low-heeled shoes. Sit and stand up straight. °· You can have sex unless your doctor tells you not to. °Relief of Pain or Discomfort °· Wear a good support bra if your breasts are sore. °· Take warm water baths (sitz baths) to soothe pain or discomfort caused by hemorrhoids. Use hemorrhoid cream if your  doctor says it is okay. °· Rest with your legs raised if you have leg cramps or low back pain. °· Wear support hose if you have puffy, bulging veins (varicose veins) in your legs. Raise (elevate) your feet for 15 minutes, 3-4 times a day. Limit salt in your diet. °Prenatal Care °· Schedule your prenatal visits by the twelfth week of pregnancy. °· Write down your questions. Take them to your prenatal visits. °· Keep all your prenatal visits as told by your doctor. °Safety °· Wear your seat belt at all times when driving. °· Make a list of emergency phone numbers. The list should include numbers for family, friends, the hospital, and police and fire departments. °General Tips °· Ask your doctor for a referral to a local prenatal class. Begin classes no later than at the start of month 6 of your pregnancy. °· Ask for help if you need counseling or help with nutrition. Your doctor can give you advice or tell you where to go for help. °· Do not use hot tubs, steam rooms, or saunas. °· Do not douche or use tampons or scented sanitary pads. °· Do not cross your legs for long periods of time. °· Avoid litter boxes and soil used by cats. °· Avoid all smoking, herbs, and alcohol. Avoid drugs not approved by your doctor. °· Visit your dentist. At home, brush your teeth   with a soft toothbrush. Be gentle when you floss. °GET HELP IF: °· You are dizzy. °· You have mild cramps or pressure in your lower belly. °· You have a nagging pain in your belly area. °· You continue to feel sick to your stomach, throw up, or have watery poop (diarrhea). °· You have a bad smelling fluid coming from your vagina. °· You have pain with peeing (urination). °· You have increased puffiness (swelling) in your face, hands, legs, or ankles. °GET HELP RIGHT AWAY IF:  °· You have a fever. °· You are leaking fluid from your vagina. °· You have spotting or bleeding from your vagina. °· You have very bad belly cramping or pain. °· You gain or lose weight  rapidly. °· You throw up blood. It may look like coffee grounds. °· You are around people who have German measles, fifth disease, or chickenpox. °· You have a very bad headache. °· You have shortness of breath. °· You have any kind of trauma, such as from a fall or a car accident. °Document Released: 07/25/2007 Document Revised: 06/22/2013 Document Reviewed: 12/16/2012 °ExitCare® Patient Information ©2015 [employer]. This information is not intended to replace advice given to you by your health care provider. Make sure you discuss any questions you have with your health care provider. °Human Chorionic Gonadotropin (hCG) °This is a test to confirm and monitor pregnancy or to diagnose trophoblastic disease or germ cell tumors. °As early as 10 days after a missed menstrual period (some methods can detect hCG even earlier, at one week after conception) or if your caregiver thinks that your symptoms suggest ectopic pregnancy, a failing pregnancy, trophoblastic disease, or germ cell tumors. hCG is a protein produced in the placenta of a pregnant woman. A pregnancy test is a specific blood or urine test that can detect hCG and confirm pregnancy. This hormone is able to be detected 10 days after a missed menstrual period, the time period when the fertilized egg is implanted in the woman's uterus. With some methods, hCG can be detected even earlier, at one week after conception.  °During the early weeks of pregnancy, hCG is important in maintaining function of the corpus luteum (the mass of cells that forms from a mature egg). Production of hCG increases steadily during the first trimester (8-10 weeks), peaking around the 10th week after the last menstrual cycle. Levels then fall slowly during the remainder of the pregnancy. hCG is no longer detectable within a few weeks after delivery. hCG is also produced by some germ cell tumors and increased levels are seen in trophoblastic disease. °SAMPLE COLLECTION °hCG is commonly  detected in urine. The preferred specimen is a random urine sample collected first thing in the morning. hCG can also be measured in blood drawn from a vein in the arm. °NORMAL FINDINGS °Qualitative: negative in non-pregnant women; positive in pregnancy °Quantitative:  °· Gestation less than 1 week: 5-50 Whole HCG (milli-international units/mL) °· Gestation of 2 weeks: 50-500 Whole HCG (milli-international units/mL) °· Gestation of 3 weeks: 100-10,000 Whole HCG (milli-international units/mL) °· Gestation of 4 weeks: 1,000-30,000 Whole HCG (milli-international units/mL) °· Gestation of 5 weeks 3,500-115,000 Whole HCG (milli-international units/mL) °· Gestation of 6-8 weeks: 12,000-270,000 Whole HCG (milli-international units/mL) °· Gestation of 12 weeks: 15,000-220,000 Whole HCG (milli-international units/mL) °· Males and non-pregnant females: less than 5 Whole HCG (milli-international units/mL) °Beta subunit: depends on the method and test used °Ranges for normal findings may vary among different laboratories and hospitals. You should always   check with your doctor after having lab work or other tests done to discuss the meaning of your test results and whether your values are considered within normal limits. °MEANING OF TEST  °Your caregiver will go over the test results with you and discuss the importance and meaning of your results, as well as treatment options and the need for additional tests if necessary. °OBTAINING THE TEST RESULTS °It is your responsibility to obtain your test results. Ask the lab or department performing the test when and how you will get your results. °Document Released: 03/09/2004 Document Revised: 04/30/2011 Document Reviewed: 05/11/2013 °ExitCare® Patient Information ©2015 [employer]. This information is not intended to replace advice given to you by your health care provider. Make sure you discuss any questions you have with your health care provider. ° °

## 2014-10-04 LAB — GC/CHLAMYDIA PROBE AMP (~~LOC~~) NOT AT ARMC
CHLAMYDIA, DNA PROBE: POSITIVE — AB
Neisseria Gonorrhea: NEGATIVE

## 2014-10-05 ENCOUNTER — Telehealth (HOSPITAL_COMMUNITY): Payer: Self-pay | Admitting: *Deleted

## 2014-10-05 DIAGNOSIS — A749 Chlamydial infection, unspecified: Secondary | ICD-10-CM

## 2014-10-05 DIAGNOSIS — O98811 Other maternal infectious and parasitic diseases complicating pregnancy, first trimester: Principal | ICD-10-CM

## 2014-10-05 MED ORDER — AZITHROMYCIN 500 MG PO TABS: ORAL_TABLET | ORAL | Status: DC

## 2014-10-05 NOTE — Telephone Encounter (Signed)
Telephone call to patient regarding positive chlamydia culture, patient notified. Rx routed to pharmacy per protocol. Instructed patient to complete Rx course and to notify her partner for treatment.  Instructed patient to abstain from sex for 7 days post treatment.  Report of treatment faxed to health department.

## 2014-10-13 ENCOUNTER — Ambulatory Visit (HOSPITAL_COMMUNITY): Payer: Self-pay

## 2014-10-19 ENCOUNTER — Ambulatory Visit (HOSPITAL_COMMUNITY)
Admission: RE | Admit: 2014-10-19 | Discharge: 2014-10-19 | Disposition: A | Discharge status: Home / Self Care | Payer: Medicaid Other | Source: Ambulatory Visit | Attending: Medical | Admitting: Medical

## 2014-10-19 ENCOUNTER — Inpatient Hospital Stay (HOSPITAL_COMMUNITY)
Admission: AD | Admit: 2014-10-19 | Discharge: 2014-10-19 | Disposition: A | Discharge status: Home / Self Care | Payer: Self-pay | Source: Ambulatory Visit | Attending: Obstetrics & Gynecology | Admitting: Obstetrics & Gynecology

## 2014-10-19 DIAGNOSIS — Z36 Encounter for antenatal screening of mother: Secondary | ICD-10-CM | POA: Insufficient documentation

## 2014-10-19 DIAGNOSIS — O3680X0 Pregnancy with inconclusive fetal viability, not applicable or unspecified: Secondary | ICD-10-CM

## 2014-10-19 DIAGNOSIS — Z3A01 Less than 8 weeks gestation of pregnancy: Secondary | ICD-10-CM | POA: Insufficient documentation

## 2014-10-19 DIAGNOSIS — O9989 Other specified diseases and conditions complicating pregnancy, childbirth and the puerperium: Secondary | ICD-10-CM

## 2014-10-19 DIAGNOSIS — R109 Unspecified abdominal pain: Secondary | ICD-10-CM

## 2014-10-19 NOTE — Discharge Instructions (Signed)
First Trimester of Pregnancy The first trimester of pregnancy is from week 1 until the end of week 12 (months 1 through 3). A week after a sperm fertilizes an egg, the egg will implant on the wall of the uterus. This embryo will begin to develop into a baby. Genes from you and your partner are forming the baby. The female genes determine whether the baby is a boy or a girl. At 6-8 weeks, the eyes and face are formed, and the heartbeat can be seen on ultrasound. At the end of 12 weeks, all the baby's organs are formed.  Now that you are pregnant, you will want to do everything you can to have a healthy baby. Two of the most important things are to get good prenatal care and to follow your health care provider's instructions. Prenatal care is all the medical care you receive before the baby's birth. This care will help prevent, find, and treat any problems during the pregnancy and childbirth. BODY CHANGES Your body goes through many changes during pregnancy. The changes vary from woman to woman.   You may gain or lose a couple of pounds at first.  You may feel sick to your stomach (nauseous) and throw up (vomit). If the vomiting is uncontrollable, call your health care provider.  You may tire easily.  You may develop headaches that can be relieved by medicines approved by your health care provider.  You may urinate more often. Painful urination may mean you have a bladder infection.  You may develop heartburn as a result of your pregnancy.  You may develop constipation because certain hormones are causing the muscles that push waste through your intestines to slow down.  You may develop hemorrhoids or swollen, bulging veins (varicose veins).  Your breasts may begin to grow larger and become tender. Your nipples may stick out more, and the tissue that surrounds them (areola) may become darker.  Your gums may bleed and may be sensitive to brushing and flossing.  Dark spots or blotches (chloasma,  mask of pregnancy) may develop on your face. This will likely fade after the baby is born.  Your menstrual periods will stop.  You may have a loss of appetite.  You may develop cravings for certain kinds of food.  You may have changes in your emotions from day to day, such as being excited to be pregnant or being concerned that something may go wrong with the pregnancy and baby.  You may have more vivid and strange dreams.  You may have changes in your hair. These can include thickening of your hair, rapid growth, and changes in texture. Some women also have hair loss during or after pregnancy, or hair that feels dry or thin. Your hair will most likely return to normal after your baby is born. WHAT TO EXPECT AT YOUR PRENATAL VISITS During a routine prenatal visit:  You will be weighed to make sure you and the baby are growing normally.  Your blood pressure will be taken.  Your abdomen will be measured to track your baby's growth.  The fetal heartbeat will be listened to starting around week 10 or 12 of your pregnancy.  Test results from any previous visits will be discussed. Your health care provider may ask you:  How you are feeling.  If you are feeling the baby move.  If you have had any abnormal symptoms, such as leaking fluid, bleeding, severe headaches, or abdominal cramping.  If you have any questions. Other tests   that may be performed during your first trimester include:  Blood tests to find your blood type and to check for the presence of any previous infections. They will also be used to check for low iron levels (anemia) and Rh antibodies. Later in the pregnancy, blood tests for diabetes will be done along with other tests if problems develop.  Urine tests to check for infections, diabetes, or protein in the urine.  An ultrasound to confirm the proper growth and development of the baby.  An amniocentesis to check for possible genetic problems.  Fetal screens for  spina bifida and Down syndrome.  You may need other tests to make sure you and the baby are doing well. HOME CARE INSTRUCTIONS  Medicines  Follow your health care provider's instructions regarding medicine use. Specific medicines may be either safe or unsafe to take during pregnancy.  Take your prenatal vitamins as directed.  If you develop constipation, try taking a stool softener if your health care provider approves. Diet  Eat regular, well-balanced meals. Choose a variety of foods, such as meat or vegetable-based protein, fish, milk and low-fat dairy products, vegetables, fruits, and whole grain breads and cereals. Your health care provider will help you determine the amount of weight gain that is right for you.  Avoid raw meat and uncooked cheese. These carry germs that can cause birth defects in the baby.  Eating four or five small meals rather than three large meals a day may help relieve nausea and vomiting. If you start to feel nauseous, eating a few soda crackers can be helpful. Drinking liquids between meals instead of during meals also seems to help nausea and vomiting.  If you develop constipation, eat more high-fiber foods, such as fresh vegetables or fruit and whole grains. Drink enough fluids to keep your urine clear or pale yellow. Activity and Exercise  Exercise only as directed by your health care provider. Exercising will help you:  Control your weight.  Stay in shape.  Be prepared for labor and delivery.  Experiencing pain or cramping in the lower abdomen or low back is a good sign that you should stop exercising. Check with your health care provider before continuing normal exercises.  Try to avoid standing for long periods of time. Move your legs often if you must stand in one place for a long time.  Avoid heavy lifting.  Wear low-heeled shoes, and practice good posture.  You may continue to have sex unless your health care provider directs you  otherwise. Relief of Pain or Discomfort  Wear a good support bra for breast tenderness.   Take warm sitz baths to soothe any pain or discomfort caused by hemorrhoids. Use hemorrhoid cream if your health care provider approves.   Rest with your legs elevated if you have leg cramps or low back pain.  If you develop varicose veins in your legs, wear support hose. Elevate your feet for 15 minutes, 3-4 times a day. Limit salt in your diet. Prenatal Care  Schedule your prenatal visits by the twelfth week of pregnancy. They are usually scheduled monthly at first, then more often in the last 2 months before delivery.  Write down your questions. Take them to your prenatal visits.  Keep all your prenatal visits as directed by your health care provider. Safety  Wear your seat belt at all times when driving.  Make a list of emergency phone numbers, including numbers for family, friends, the hospital, and police and fire departments. General Tips    Ask your health care provider for a referral to a local prenatal education class. Begin classes no later than at the beginning of month 6 of your pregnancy.  Ask for help if you have counseling or nutritional needs during pregnancy. Your health care provider can offer advice or refer you to specialists for help with various needs.  Do not use hot tubs, steam rooms, or saunas.  Do not douche or use tampons or scented sanitary pads.  Do not cross your legs for long periods of time.  Avoid cat litter boxes and soil used by cats. These carry germs that can cause birth defects in the baby and possibly loss of the fetus by miscarriage or stillbirth.  Avoid all smoking, herbs, alcohol, and medicines not prescribed by your health care provider. Chemicals in these affect the formation and growth of the baby.  Schedule a dentist appointment. At home, brush your teeth with a soft toothbrush and be gentle when you floss. SEEK MEDICAL CARE IF:   You have  dizziness.  You have mild pelvic cramps, pelvic pressure, or nagging pain in the abdominal area.  You have persistent nausea, vomiting, or diarrhea.  You have a bad smelling vaginal discharge.  You have pain with urination.  You notice increased swelling in your face, hands, legs, or ankles. SEEK IMMEDIATE MEDICAL CARE IF:   You have a fever.  You are leaking fluid from your vagina.  You have spotting or bleeding from your vagina.  You have severe abdominal cramping or pain.  You have rapid weight gain or loss.  You vomit blood or material that looks like coffee grounds.  You are exposed to German measles and have never had them.  You are exposed to fifth disease or chickenpox.  You develop a severe headache.  You have shortness of breath.  You have any kind of trauma, such as from a fall or a car accident. Document Released: 01/30/2001 Document Revised: 06/22/2013 Document Reviewed: 12/16/2012 [employer] Patient Information 2015 [employer]. This information is not intended to replace advice given to you by your health care provider. Make sure you discuss any questions you have with your health care provider.  

## 2014-10-19 NOTE — [facility] Provider Note (Signed)
No chief complaint on file.   Subjective:   Pt is a 24 y.o. G1P0 here for follow-up BHCG.  Ultrasound showed IUP @ [redacted]w[redacted]d. EDC 06/15/15.  Pt here today with no report of abdominal pain or vaginal bleeding.   All other systems negative.      Past Medical History  Diagnosis Date  . Degenerative disc disease   . STD (female)   . Degenerative joint disease of spine     OB History  Gravida Para Term Preterm AB SAB TAB Ectopic Multiple Living  1             # Outcome Date GA Lbr Len/2nd Weight Sex Delivery Anes PTL Lv  1 Current               Family History  Problem Relation Age of Onset  . Colon cancer Neg Hx   . Cervical cancer Maternal Aunt    US Ob Comp Less 14 Wks  10/01/2014   CLINICAL DATA:  Abdominal pain. Irregular cycles. Obesity. Quantitative beta HCG is 34.  EXAM: OBSTETRIC <14 WK [place] AND TRANSVAGINAL OB US  TECHNIQUE: Both transabdominal and transvaginal ultrasound examinations were performed for complete evaluation of the gestation as well as the maternal uterus, adnexal regions, and pelvic cul-de-sac. Transvaginal technique was performed to assess early pregnancy.  COMPARISON:  None applicable  FINDINGS: Intrauterine gestational sac: None seen  Yolk sac:  None seen  Embryo:  None seen  Maternal uterus/adnexae: Right corpus luteum cyst is 1.7 x 1.5 x 1.4 cm. Left ovary has a normal appearance. Mildly thickened endometrium. No free pelvic fluid.  IMPRESSION: 1. No intrauterine or ectopic pregnancy identified. 2. Serial quantitative beta HCG values and follow-up ultrasound are recommended as appropriate to document progression of and location of pregnancy. Ectopic pregnancy has not been excluded.   Electronically Signed   By: Norva Pavlov M.D.   On: 10/01/2014 17:41   US Ob Transvaginal  10/19/2014   CLINICAL DATA:  Pregnancy of unknown location  EXAM: TRANSVAGINAL OB ULTRASOUND  TECHNIQUE: Transvaginal ultrasound was performed for complete evaluation of the gestation as well as  the maternal uterus, adnexal regions, and pelvic cul-de-sac.  COMPARISON:  10/01/2014  FINDINGS: Intrauterine gestational sac: Visualized, normal in shape and position  Yolk sac:  Present  Embryo:  Present  Cardiac Activity: Present  Heart Rate: 108 bpm  CRL:   2.9  mm   5 w 6 d                  [place] EDC: 06/15/2015  Maternal uterus/adnexae:  No subchorionic hemorrhage.  LEFT ovary normal size and morphology, 3.6 x 2.4 x 1.6 cm.  RIGHT ovary normal size and morphology, 3.7 x 1.8 x 2.0 cm.  No adnexal masses or free pelvic fluid.  IMPRESSION: Single live intrauterine gestation measured at 5 weeks 6 days EGA by crown-rump length.   Electronically Signed   By: Ulyses Southward M.D.   On: 10/19/2014 08:52   US Ob Transvaginal  10/01/2014   CLINICAL DATA:  Abdominal pain. Irregular cycles. Obesity. Quantitative beta HCG is 34.  EXAM: OBSTETRIC <14 WK [place] AND TRANSVAGINAL OB US  TECHNIQUE: Both transabdominal and transvaginal ultrasound examinations were performed for complete evaluation of the gestation as well as the maternal uterus, adnexal regions, and pelvic cul-de-sac. Transvaginal technique was performed to assess early pregnancy.  COMPARISON:  None applicable  FINDINGS: Intrauterine gestational sac: None seen  Yolk sac:  None seen  Embryo:  None seen  Maternal uterus/adnexae: Right corpus luteum cyst is 1.7 x 1.5 x 1.4 cm. Left ovary has a normal appearance. Mildly thickened endometrium. No free pelvic fluid.  IMPRESSION: 1. No intrauterine or ectopic pregnancy identified. 2. Serial quantitative beta HCG values and follow-up ultrasound are recommended as appropriate to document progression of and location of pregnancy. Ectopic pregnancy has not been excluded.   Electronically Signed   By: Norva Pavlov M.D.   On: 10/01/2014 17:41   US Ob Transvaginal  10/19/2014   CLINICAL DATA:  Pregnancy of unknown location  EXAM: TRANSVAGINAL OB ULTRASOUND  TECHNIQUE: Transvaginal ultrasound was performed for complete  evaluation of the gestation as well as the maternal uterus, adnexal regions, and pelvic cul-de-sac.  COMPARISON:  10/01/2014  FINDINGS: Intrauterine gestational sac: Visualized, normal in shape and position  Yolk sac:  Present  Embryo:  Present  Cardiac Activity: Present  Heart Rate: 108 bpm  CRL:   2.9  mm   5 w 6 d                  [place] EDC: 06/15/2015  Maternal uterus/adnexae:  No subchorionic hemorrhage.  LEFT ovary normal size and morphology, 3.6 x 2.4 x 1.6 cm.  RIGHT ovary normal size and morphology, 3.7 x 1.8 x 2.0 cm.  No adnexal masses or free pelvic fluid.  IMPRESSION: Single live intrauterine gestation measured at 5 weeks 6 days EGA by crown-rump length.   Electronically Signed   By: Ulyses Southward M.D.   On: 10/19/2014 08:52   Objective: Physical Exam  There were no vitals filed for this visit. Constitutional: She is oriented to person, place, and time. She appears well-developed and well-nourished. No distress.  Pulmonary/Chest: Effort normal. No respiratory distress.  Musculoskeletal: Normal range of motion.  Neurological: She is alert and oriented to person, place, and time.  Skin: Skin is warm and dry.    Assessment: 24 y.o. G1P0 at 5w 6d Follow-up U/S   Plan: Provider List given Start Routine Prenatal care

## 2014-12-07 LAB — OB RESULTS CONSOLE RUBELLA ANTIBODY, IGM: Rubella: IMMUNE

## 2014-12-07 LAB — OB RESULTS CONSOLE ANTIBODY SCREEN: Antibody Screen: NEGATIVE

## 2014-12-07 LAB — OB RESULTS CONSOLE RPR: RPR: NONREACTIVE

## 2014-12-07 LAB — OB RESULTS CONSOLE ABO/RH: RH Type: POSITIVE

## 2014-12-07 LAB — OB RESULTS CONSOLE HEPATITIS B SURFACE ANTIGEN: Hepatitis B Surface Ag: NEGATIVE

## 2015-03-21 ENCOUNTER — Inpatient Hospital Stay (HOSPITAL_COMMUNITY)
Admission: AD | Admit: 2015-03-21 | Discharge: 2015-03-21 | Disposition: A | Discharge status: Home / Self Care | Payer: Medicaid Other | Source: Ambulatory Visit | Attending: Obstetrics and Gynecology | Admitting: Obstetrics and Gynecology

## 2015-03-21 ENCOUNTER — Encounter (HOSPITAL_COMMUNITY): Payer: Self-pay | Admitting: *Deleted

## 2015-03-21 DIAGNOSIS — Z3A27 27 weeks gestation of pregnancy: Secondary | ICD-10-CM | POA: Diagnosis not present

## 2015-03-21 DIAGNOSIS — N898 Other specified noninflammatory disorders of vagina: Secondary | ICD-10-CM | POA: Diagnosis not present

## 2015-03-21 DIAGNOSIS — O26892 Other specified pregnancy related conditions, second trimester: Secondary | ICD-10-CM | POA: Insufficient documentation

## 2015-03-21 HISTORY — DX: Ventral hernia without obstruction or gangrene: K43.9

## 2015-03-21 LAB — URINALYSIS, ROUTINE W REFLEX MICROSCOPIC
Bilirubin Urine: NEGATIVE
GLUCOSE, UA: NEGATIVE mg/dL
Ketones, ur: >80 mg/dL — AB
Nitrite: NEGATIVE
PROTEIN: NEGATIVE mg/dL
Specific Gravity, Urine: 1.025 (ref 1.005–1.030)
pH: 6.5 (ref 5.0–8.0)

## 2015-03-21 LAB — URINE MICROSCOPIC-ADD ON: RBC / HPF: NONE SEEN RBC/hpf (ref 0–5)

## 2015-03-21 LAB — WET PREP, GENITAL
CLUE CELLS WET PREP: NONE SEEN
SPERM: NONE SEEN
TRICH WET PREP: NONE SEEN
YEAST WET PREP: NONE SEEN

## 2015-03-21 LAB — POCT FERN TEST: POCT Fern Test: NEGATIVE

## 2015-03-21 NOTE — Discharge Instructions (Signed)

## 2015-03-21 NOTE — MAU Provider Note (Signed)
History    Regina Nelson is a 25y.o. G1P0 at 27.5wks who presents, unannounced, for vaginal leakage.  Patient states leaking started 4 days ago and has not been wearing panyliners or pads, but has been saturating through her underwear.  Patient denies recent sexual intercourse. Patient also denies recent fetal movement stating she has not felt the baby move since yesterday.  Patient further denies VB and pain.  Patient does reports some irritation noted on "thighs near my underwear."  There are no active problems to display for this patient.   Chief Complaint  Patient presents with  . Vaginal Discharge   HPI  OB History    Gravida Para Term Preterm AB TAB SAB Ectopic Multiple Living   1               Past Medical History  Diagnosis Date  . Degenerative disc disease   . STD (female)   . Degenerative joint disease of spine   . Hernia of abdominal wall     Past Surgical History  Procedure Laterality Date  . Cholecystectomy    . Wisdom tooth extraction      Family History  Problem Relation Age of Onset  . Colon cancer Neg Hx   . Cervical cancer Maternal Aunt     Social History  Substance Use Topics  . Smoking status: Never Smoker   . Smokeless tobacco: Never Used  . Alcohol Use: No     Comment: socially    Allergies:  Allergies  Allergen Reactions  . Bee Venom Anaphylaxis    Prescriptions prior to admission  Medication Sig Dispense Refill Last Dose  . acetaminophen (TYLENOL) 500 MG tablet Take 500 mg by mouth every 6 (six) hours as needed for moderate pain.    Past Week at Unknown time  . prenatal vitamin w/FE, FA (PRENATAL 1 + 1) 27-1 MG TABS tablet Take 1 tablet by mouth 3 (three) times daily.    03/20/2015 at Unknown time    ROS  See HPI Above Physical Exam   Blood pressure 119/70, pulse 85, temperature 98 F (36.7 C), temperature source Oral, resp. rate 18.  Results for orders placed or performed during the hospital encounter of 03/21/15 (from  the past 24 hour(s))  Wet prep, genital     Status: Abnormal   Collection Time: 03/21/15  8:10 PM  Result Value Ref Range   Yeast Wet Prep HPF POC NONE SEEN NONE SEEN   Trich, Wet Prep NONE SEEN NONE SEEN   Clue Cells Wet Prep HPF POC NONE SEEN NONE SEEN   WBC, Wet Prep HPF POC MODERATE (A) NONE SEEN   Sperm NONE SEEN   Urinalysis, Routine w reflex microscopic (not at [facility])     Status: Abnormal   Collection Time: 03/21/15  8:35 PM  Result Value Ref Range   Color, Urine YELLOW YELLOW   APPearance CLEAR CLEAR   Specific Gravity, Urine 1.025 1.005 - 1.030   pH 6.5 5.0 - 8.0   Glucose, UA NEGATIVE NEGATIVE mg/dL   Hgb urine dipstick TRACE (A) NEGATIVE   Bilirubin Urine NEGATIVE NEGATIVE   Ketones, ur >80 (A) NEGATIVE mg/dL   Protein, ur NEGATIVE NEGATIVE mg/dL   Nitrite NEGATIVE NEGATIVE   Leukocytes, UA SMALL (A) NEGATIVE  Urine microscopic-add on     Status: Abnormal   Collection Time: 03/21/15  8:35 PM  Result Value Ref Range   Squamous Epithelial / LPF 6-30 (A) NONE SEEN   WBC, UA  6-30 0 - 5 WBC/hpf   RBC / HPF NONE SEEN 0 - 5 RBC/hpf   Bacteria, UA FEW (A) NONE SEEN  Fern Test     Status: None   Collection Time: 03/21/15  8:49 PM  Result Value Ref Range   POCT Fern Test Negative = intact amniotic membranes     Physical Exam  Vitals reviewed. Constitutional: She is oriented to person, place, and time. She appears well-developed and well-nourished.  HENT:  Head: Normocephalic and atraumatic.  Eyes: Pupils are equal, round, and reactive to light.  Neck: Normal range of motion.  Cardiovascular: Normal rate.   Respiratory: Effort normal.  GI: Soft.  Genitourinary: Rectal exam shows external hemorrhoid (Non-tender). Uterus is enlarged. Cervix exhibits discharge and friability. Cervix exhibits no motion tenderness. No bleeding in the vagina. Vaginal discharge found.  Sterile Speculum Exam: -Vaginal Vault: Sm amt thin white discharge -wet prep collected -Cervix:Friable to  touch, Thick mucoid discharge from os-GC/CT collected -Bimanual Exam: Closed/Long/Thick/Ballotable  Musculoskeletal: Normal range of motion. She exhibits edema (Nonpitting).  Neurological: She is alert and oriented to person, place, and time.  Skin: Skin is warm and dry. No rash noted.     FHR:135 bpm, Mod Var, -Decels, +Accels UC: None ED Course  Assessment: IUP at 27.5wks Cat I FT Vaginal Discharge  Plan: -PE as above -Labs; Wet prep, UA, Gc/Ct, Crist Fat -Fern Negative -Discussed vaginal discharge during pregnancy -Discussed usage of hydrocortisone cream on areas of perceived irritation -Discussed usage of hemorrhoid cream if irritation occurs  Follow Up (2045) -Wet prep negative -Discussed leukorrhea and vaginal hygiene during pregnancy -Keep appt as scheduled -Encouraged to call if any questions or concerns arise prior to next scheduled office visit.  --Discharged to home in stable condition  Cherre Robins CNM, MSN 03/21/2015 7:54 PM

## 2015-03-21 NOTE — MAU Note (Signed)
Urine sent to lab 

## 2015-03-21 NOTE — MAU Note (Signed)
Pt presents complaining of increased watery discharge for 4 days. Reports no fetal movement since yesterday. Denies bleeding. Lower side pain.

## 2015-03-22 LAB — GC/CHLAMYDIA PROBE AMP (~~LOC~~) NOT AT ARMC
Chlamydia: NEGATIVE
Neisseria Gonorrhea: NEGATIVE

## 2015-05-09 ENCOUNTER — Inpatient Hospital Stay (HOSPITAL_COMMUNITY)
Admission: AD | Admit: 2015-05-09 | Discharge: 2015-05-09 | Disposition: A | Discharge status: Home / Self Care | Payer: Medicaid Other | Source: Ambulatory Visit | Attending: Obstetrics and Gynecology | Admitting: Obstetrics and Gynecology

## 2015-05-09 ENCOUNTER — Encounter (HOSPITAL_COMMUNITY): Payer: Self-pay | Admitting: *Deleted

## 2015-05-09 DIAGNOSIS — Z3A34 34 weeks gestation of pregnancy: Secondary | ICD-10-CM | POA: Diagnosis not present

## 2015-05-09 DIAGNOSIS — O368131 Decreased fetal movements, third trimester, fetus 1: Secondary | ICD-10-CM

## 2015-05-09 DIAGNOSIS — O368130 Decreased fetal movements, third trimester, not applicable or unspecified: Secondary | ICD-10-CM | POA: Insufficient documentation

## 2015-05-09 NOTE — Discharge Instructions (Signed)
Fetal Movement Counts  Patient Name: __________________________________________________ Patient Due Date: ____________________  Performing a fetal movement count is highly recommended in high-risk pregnancies, but it is good for every pregnant woman to do. Your health care provider may ask you to start counting fetal movements at 28 weeks of the pregnancy. Fetal movements often increase:  · After eating a full meal.  · After physical activity.  · After eating or drinking something sweet or cold.  · At rest.  Pay attention to when you feel the baby is most active. This will help you notice a pattern of your baby's sleep and wake cycles and what factors contribute to an increase in fetal movement. It is important to perform a fetal movement count at the same time each day when your baby is normally most active.   HOW TO COUNT FETAL MOVEMENTS  1. Find a quiet and comfortable area to sit or lie down on your left side. Lying on your left side provides the best blood and oxygen circulation to your baby.  2. Write down the day and time on a sheet of paper or in a journal.  3. Start counting kicks, flutters, swishes, rolls, or jabs in a 2-hour period. You should feel at least 10 movements within 2 hours.  4. If you do not feel 10 movements in 2 hours, wait 2-3 hours and count again. Look for a change in the pattern or not enough counts in 2 hours.  SEEK MEDICAL CARE IF:  · You feel less than 10 counts in 2 hours, tried twice.  · There is no movement in over an hour.  · The pattern is changing or taking longer each day to reach 10 counts in 2 hours.  · You feel the baby is not moving as he or she usually does.  Date: ____________ Movements: ____________ Start time: ____________ Finish time: ____________   Date: ____________ Movements: ____________ Start time: ____________ Finish time: ____________  Date: ____________ Movements: ____________ Start time: ____________ Finish time: ____________  Date: ____________ Movements:  ____________ Start time: ____________ Finish time: ____________  Date: ____________ Movements: ____________ Start time: ____________ Finish time: ____________  Date: ____________ Movements: ____________ Start time: ____________ Finish time: ____________  Date: ____________ Movements: ____________ Start time: ____________ Finish time: ____________  Date: ____________ Movements: ____________ Start time: ____________ Finish time: ____________   Date: ____________ Movements: ____________ Start time: ____________ Finish time: ____________  Date: ____________ Movements: ____________ Start time: ____________ Finish time: ____________  Date: ____________ Movements: ____________ Start time: ____________ Finish time: ____________  Date: ____________ Movements: ____________ Start time: ____________ Finish time: ____________  Date: ____________ Movements: ____________ Start time: ____________ Finish time: ____________  Date: ____________ Movements: ____________ Start time: ____________ Finish time: ____________  Date: ____________ Movements: ____________ Start time: ____________ Finish time: ____________   Date: ____________ Movements: ____________ Start time: ____________ Finish time: ____________  Date: ____________ Movements: ____________ Start time: ____________ Finish time: ____________  Date: ____________ Movements: ____________ Start time: ____________ Finish time: ____________  Date: ____________ Movements: ____________ Start time: ____________ Finish time: ____________  Date: ____________ Movements: ____________ Start time: ____________ Finish time: ____________  Date: ____________ Movements: ____________ Start time: ____________ Finish time: ____________  Date: ____________ Movements: ____________ Start time: ____________ Finish time: ____________   Date: ____________ Movements: ____________ Start time: ____________ Finish time: ____________  Date: ____________ Movements: ____________ Start time: ____________ Finish  time: ____________  Date: ____________ Movements: ____________ Start time: ____________ Finish time: ____________  Date: ____________ Movements: ____________ Start time:   ____________ Finish time: ____________  Date: ____________ Movements: ____________ Start time: ____________ Finish time: ____________  Date: ____________ Movements: ____________ Start time: ____________ Finish time: ____________  Date: ____________ Movements: ____________ Start time: ____________ Finish time: ____________   Date: ____________ Movements: ____________ Start time: ____________ Finish time: ____________  Date: ____________ Movements: ____________ Start time: ____________ Finish time: ____________  Date: ____________ Movements: ____________ Start time: ____________ Finish time: ____________  Date: ____________ Movements: ____________ Start time: ____________ Finish time: ____________  Date: ____________ Movements: ____________ Start time: ____________ Finish time: ____________  Date: ____________ Movements: ____________ Start time: ____________ Finish time: ____________  Date: ____________ Movements: ____________ Start time: ____________ Finish time: ____________   Date: ____________ Movements: ____________ Start time: ____________ Finish time: ____________  Date: ____________ Movements: ____________ Start time: ____________ Finish time: ____________  Date: ____________ Movements: ____________ Start time: ____________ Finish time: ____________  Date: ____________ Movements: ____________ Start time: ____________ Finish time: ____________  Date: ____________ Movements: ____________ Start time: ____________ Finish time: ____________  Date: ____________ Movements: ____________ Start time: ____________ Finish time: ____________  Date: ____________ Movements: ____________ Start time: ____________ Finish time: ____________   Date: ____________ Movements: ____________ Start time: ____________ Finish time: ____________  Date: ____________  Movements: ____________ Start time: ____________ Finish time: ____________  Date: ____________ Movements: ____________ Start time: ____________ Finish time: ____________  Date: ____________ Movements: ____________ Start time: ____________ Finish time: ____________  Date: ____________ Movements: ____________ Start time: ____________ Finish time: ____________  Date: ____________ Movements: ____________ Start time: ____________ Finish time: ____________  Date: ____________ Movements: ____________ Start time: ____________ Finish time: ____________   Date: ____________ Movements: ____________ Start time: ____________ Finish time: ____________  Date: ____________ Movements: ____________ Start time: ____________ Finish time: ____________  Date: ____________ Movements: ____________ Start time: ____________ Finish time: ____________  Date: ____________ Movements: ____________ Start time: ____________ Finish time: ____________  Date: ____________ Movements: ____________ Start time: ____________ Finish time: ____________  Date: ____________ Movements: ____________ Start time: ____________ Finish time: ____________     This information is not intended to replace advice given to you by your health care provider. Make sure you discuss any questions you have with your health care provider.     Document Released: 03/07/2006 Document Revised: 02/26/2014 Document Reviewed: 12/03/2011  Elsevier Interactive Patient Education ©2016 [employer]

## 2015-05-09 NOTE — MAU Note (Signed)
PT states the last time she felt baby move was last night around 1930.  She drank cold water, took a shower, laid down, drank some caffeine and still has not felt baby move.  Denies LOF/VB/cramping.

## 2015-05-09 NOTE — Progress Notes (Addendum)
Regina Nelson is a 25 y.o. G1P0 at 34.5 weeks c/o decrease FM since last night. She denies vb, lof or ctx.   History     There are no active problems to display for this patient.   Chief Complaint  Patient presents with  . Decreased Fetal Movement   HPI  OB History    Gravida Para Term Preterm AB TAB SAB Ectopic Multiple Living   1               Past Medical History  Diagnosis Date  . Degenerative disc disease   . STD (female)   . Degenerative joint disease of spine   . Hernia of abdominal wall     Past Surgical History  Procedure Laterality Date  . Cholecystectomy    . Wisdom tooth extraction      Family History  Problem Relation Age of Onset  . Colon cancer Neg Hx   . Cervical cancer Maternal Aunt     Social History  Substance Use Topics  . Smoking status: Never Smoker   . Smokeless tobacco: Never Used  . Alcohol Use: No     Comment: socially    Allergies:  Allergies  Allergen Reactions  . Bee Venom Anaphylaxis    Prescriptions prior to admission  Medication Sig Dispense Refill Last Dose  . acetaminophen (TYLENOL) 500 MG tablet Take 500 mg by mouth every 6 (six) hours as needed for moderate pain.    Past Week at Unknown time  . prenatal vitamin w/FE, FA (PRENATAL 1 + 1) 27-1 MG TABS tablet Take 1 tablet by mouth 3 (three) times daily.    03/20/2015 at Unknown time    ROS See HPI above, all other systems are negative  Physical Exam   Blood pressure 123/69, pulse 89, temperature 97.9 F (36.6 C), temperature source Oral, resp. rate 16.  Physical Exam Ext:  WNL ABD: Soft, non tender to palpation, no rebound or guarding SVE: FT/T/H   ED Course  Assessment: IUP at  34.5weeks Membranes: intact FHR: Category 1 CTX: occasional, not felt by pt   Plan: -Discussed need to follow up in office next appointment -Bleeding and PTL Precautions -kick counts -Encouraged to call if any questions or concerns arise prior to next scheduled  office visit.  -Discharged to home in stable condition    Luva Metzger, CNM, MSN 05/09/2015. 11:55 AM

## 2015-05-16 LAB — OB RESULTS CONSOLE GC/CHLAMYDIA
CHLAMYDIA, DNA PROBE: NEGATIVE
GC PROBE AMP, GENITAL: NEGATIVE

## 2015-05-16 LAB — OB RESULTS CONSOLE GBS: GBS: POSITIVE

## 2015-05-20 ENCOUNTER — Encounter (HOSPITAL_COMMUNITY): Payer: Self-pay

## 2015-05-20 ENCOUNTER — Inpatient Hospital Stay (HOSPITAL_COMMUNITY)
Admission: AD | Admit: 2015-05-20 | Discharge: 2015-05-20 | Disposition: A | Discharge status: Home / Self Care | Payer: Medicaid Other | Source: Ambulatory Visit | Attending: Obstetrics and Gynecology | Admitting: Obstetrics and Gynecology

## 2015-05-20 DIAGNOSIS — Z6841 Body Mass Index (BMI) 40.0 and over, adult: Secondary | ICD-10-CM | POA: Insufficient documentation

## 2015-05-20 DIAGNOSIS — O4703 False labor before 37 completed weeks of gestation, third trimester: Secondary | ICD-10-CM | POA: Diagnosis not present

## 2015-05-20 DIAGNOSIS — O1203 Gestational edema, third trimester: Secondary | ICD-10-CM | POA: Diagnosis present

## 2015-05-20 DIAGNOSIS — R030 Elevated blood-pressure reading, without diagnosis of hypertension: Secondary | ICD-10-CM | POA: Diagnosis not present

## 2015-05-20 DIAGNOSIS — Z8711 Personal history of peptic ulcer disease: Secondary | ICD-10-CM

## 2015-05-20 DIAGNOSIS — O99213 Obesity complicating pregnancy, third trimester: Secondary | ICD-10-CM | POA: Diagnosis not present

## 2015-05-20 DIAGNOSIS — Z3A36 36 weeks gestation of pregnancy: Secondary | ICD-10-CM | POA: Diagnosis not present

## 2015-05-20 DIAGNOSIS — O99820 Streptococcus B carrier state complicating pregnancy: Secondary | ICD-10-CM | POA: Diagnosis not present

## 2015-05-20 DIAGNOSIS — IMO0001 Reserved for inherently not codable concepts without codable children: Secondary | ICD-10-CM

## 2015-05-20 DIAGNOSIS — B951 Streptococcus, group B, as the cause of diseases classified elsewhere: Secondary | ICD-10-CM | POA: Diagnosis present

## 2015-05-20 DIAGNOSIS — R601 Generalized edema: Secondary | ICD-10-CM

## 2015-05-20 DIAGNOSIS — O26893 Other specified pregnancy related conditions, third trimester: Secondary | ICD-10-CM | POA: Insufficient documentation

## 2015-05-20 DIAGNOSIS — M6208 Separation of muscle (nontraumatic), other site: Secondary | ICD-10-CM | POA: Diagnosis present

## 2015-05-20 LAB — URINALYSIS, ROUTINE W REFLEX MICROSCOPIC
BILIRUBIN URINE: NEGATIVE
GLUCOSE, UA: NEGATIVE mg/dL
KETONES UR: NEGATIVE mg/dL
Nitrite: NEGATIVE
PH: 6.5 (ref 5.0–8.0)
Protein, ur: NEGATIVE mg/dL
Specific Gravity, Urine: 1.01 (ref 1.005–1.030)

## 2015-05-20 LAB — CBC
HCT: 33.9 % — ABNORMAL LOW (ref 36.0–46.0)
Hemoglobin: 11.6 g/dL — ABNORMAL LOW (ref 12.0–15.0)
MCH: 27.4 pg (ref 26.0–34.0)
MCHC: 34.2 g/dL (ref 30.0–36.0)
MCV: 80 fL (ref 78.0–100.0)
PLATELETS: 220 10*3/uL (ref 150–400)
RBC: 4.24 MIL/uL (ref 3.87–5.11)
RDW: 13.6 % (ref 11.5–15.5)
WBC: 11.9 10*3/uL — ABNORMAL HIGH (ref 4.0–10.5)

## 2015-05-20 LAB — COMPREHENSIVE METABOLIC PANEL
ALK PHOS: 110 U/L (ref 38–126)
ALT: 15 U/L (ref 14–54)
AST: 18 U/L (ref 15–41)
Albumin: 2.9 g/dL — ABNORMAL LOW (ref 3.5–5.0)
Anion gap: 5 (ref 5–15)
BUN: 6 mg/dL (ref 6–20)
CALCIUM: 8.6 mg/dL — AB (ref 8.9–10.3)
CO2: 20 mmol/L — ABNORMAL LOW (ref 22–32)
Chloride: 110 mmol/L (ref 101–111)
Creatinine, Ser: 0.39 mg/dL — ABNORMAL LOW (ref 0.44–1.00)
Glucose, Bld: 111 mg/dL — ABNORMAL HIGH (ref 65–99)
POTASSIUM: 3.4 mmol/L — AB (ref 3.5–5.1)
SODIUM: 135 mmol/L (ref 135–145)
TOTAL PROTEIN: 6.2 g/dL — AB (ref 6.5–8.1)
Total Bilirubin: 0.2 mg/dL — ABNORMAL LOW (ref 0.3–1.2)

## 2015-05-20 LAB — URINE MICROSCOPIC-ADD ON: RBC / HPF: NONE SEEN RBC/hpf (ref 0–5)

## 2015-05-20 LAB — PROTEIN / CREATININE RATIO, URINE
Creatinine, Urine: 83 mg/dL
PROTEIN CREATININE RATIO: 0.2 mg/mg{creat} — AB (ref 0.00–0.15)
Total Protein, Urine: 17 mg/dL

## 2015-05-20 LAB — URIC ACID: Uric Acid, Serum: 4.9 mg/dL (ref 2.3–6.6)

## 2015-05-20 LAB — LACTATE DEHYDROGENASE: LDH: 148 U/L (ref 98–192)

## 2015-05-20 NOTE — MAU Note (Signed)
Report given to Regina BridgemanVicki Latham CNM Patient presents with bilateral lower extremity edema, dizziness, shortness of breath, fell out of her chair at work yesterday, [facility]M to [facility]

## 2015-05-20 NOTE — Discharge Instructions (Signed)

## 2015-05-20 NOTE — MAU Note (Signed)
Bilaterally lower extremity edema has had for months much worse today, felt dizzy and fell out of her chair at work hurting her head and wrist, denies vaginal bleeding, lost mucus plug last week, 2 cm on Monday, positive FM

## 2015-05-20 NOTE — Progress Notes (Signed)
History      Regina Nelson is  25 yo, G1P0 at 36.2wks presenting to MAU unannounced for generalized edema with extreme bilaterla extremity edema that hurts when she walks/stands and occasional episodes of dizziness "when she beds over".   Also reports falling out of her chair at work yesterday when it rolled back causing her to hit her head and wrist.    Denies headache, blurred vision or epigastric pain.    Patient Active Problem List   Diagnosis Date Noted  . Morbid obesity with BMI of 40.0-44.9, adult (HCC) 05/20/2015  . Diastasis recti 05/20/2015  . Hx of peptic ulcer 05/20/2015  . Positive GBS test 05/20/2015    Chief Complaint  Patient presents with  . Leg Swelling  . Dizziness  . Shortness of Breath  . Abdominal Pain   HPI  OB History    Gravida Para Term Preterm AB TAB SAB Ectopic Multiple Living   1               Past Medical History  Diagnosis Date  . Degenerative disc disease   . STD (female)   . Degenerative joint disease of spine   . Hernia of abdominal wall     Past Surgical History  Procedure Laterality Date  . Cholecystectomy    . Wisdom tooth extraction      Family History  Problem Relation Age of Onset  . Colon cancer Neg Hx   . Cervical cancer Maternal Aunt     Social History  Substance Use Topics  . Smoking status: Never Smoker   . Smokeless tobacco: Never Used  . Alcohol Use: No     Comment: socially    Allergies:  Allergies  Allergen Reactions  . Bee Venom Anaphylaxis    No prescriptions prior to admission    ROS Physical Exam   Blood pressure 130/80, pulse 84, temperature 98.1 F (36.7 C), temperature source Oral, resp. rate 18, height  (1.676 m), weight 122.834 kg (270 lb 12.8 oz), SpO2 98 %.  Filed Vitals:   05/20/15 1912 05/20/15 1916 05/20/15 1917 05/20/15 2113  BP:  132/69  130/80  Pulse:  87  84  Temp:    98.1 F (36.7 C)  TempSrc:    Oral  Resp:    18  Height:      Weight:      SpO2: 98%   98%     Results for orders placed or performed during the hospital encounter of 05/20/15 (from the past 24 hour(s))  Urinalysis, Routine w reflex microscopic (not at [facility])     Status: Abnormal   Collection Time: 05/20/15  6:05 PM  Result Value Ref Range   Color, Urine YELLOW YELLOW   APPearance CLEAR CLEAR   Specific Gravity, Urine 1.010 1.005 - 1.030   pH 6.5 5.0 - 8.0   Glucose, UA NEGATIVE NEGATIVE mg/dL   Hgb urine dipstick TRACE (A) NEGATIVE   Bilirubin Urine NEGATIVE NEGATIVE   Ketones, ur NEGATIVE NEGATIVE mg/dL   Protein, ur NEGATIVE NEGATIVE mg/dL   Nitrite NEGATIVE NEGATIVE   Leukocytes, UA SMALL (A) NEGATIVE  Protein / creatinine ratio, urine     Status: Abnormal   Collection Time: 05/20/15  6:05 PM  Result Value Ref Range   Creatinine, Urine 83.00 mg/dL   Total Protein, Urine 17 mg/dL   Protein Creatinine Ratio 0.20 (H) 0.00 - 0.15 mg/mg[Cre]  Urine microscopic-add on     Status: Abnormal   Collection  Time: 05/20/15  6:05 PM  Result Value Ref Range   Squamous Epithelial / LPF 0-5 (A) NONE SEEN   WBC, UA 0-5 0 - 5 WBC/hpf   RBC / HPF NONE SEEN 0 - 5 RBC/hpf   Bacteria, UA MANY (A) NONE SEEN  CBC     Status: Abnormal   Collection Time: 05/20/15  7:23 PM  Result Value Ref Range   WBC 11.9 (H) 4.0 - 10.5 K/uL   RBC 4.24 3.87 - 5.11 MIL/uL   Hemoglobin 11.6 (L) 12.0 - 15.0 g/dL   HCT 32.433.9 (L) 40.136.0 - 02.746.0 %   MCV 80.0 78.0 - 100.0 fL   MCH 27.4 26.0 - 34.0 pg   MCHC 34.2 30.0 - 36.0 g/dL   RDW 25.313.6 66.411.5 - 40.315.5 %   Platelets 220 150 - 400 K/uL  Comprehensive metabolic panel     Status: Abnormal   Collection Time: 05/20/15  7:23 PM  Result Value Ref Range   Sodium 135 135 - 145 mmol/L   Potassium 3.4 (L) 3.5 - 5.1 mmol/L   Chloride 110 101 - 111 mmol/L   CO2 20 (L) 22 - 32 mmol/L   Glucose, Bld 111 (H) 65 - 99 mg/dL   BUN 6 6 - 20 mg/dL   Creatinine, Ser 4.740.39 (L) 0.44 - 1.00 mg/dL   Calcium 8.6 (L) 8.9 - 10.3 mg/dL   Total Protein 6.2 (L) 6.5 - 8.1 g/dL    Albumin 2.9 (L) 3.5 - 5.0 g/dL   AST 18 15 - 41 U/L   ALT 15 14 - 54 U/L   Alkaline Phosphatase 110 38 - 126 U/L   Total Bilirubin 0.2 (L) 0.3 - 1.2 mg/dL   Anion gap 5.0 5 - 15  Lactate dehydrogenase     Status: None   Collection Time: 05/20/15  7:23 PM  Result Value Ref Range   LDH 148 98 - 192 U/L  Uric acid     Status: None   Collection Time: 05/20/15  7:23 PM  Result Value Ref Range   Uric Acid, Serum 4.9 2.3 - 6.6 mg/dL      Physical Exam  Constitutional: She is oriented to person, place, and time. She appears well-developed.  HENT:  Head: Normocephalic.  Eyes: Pupils are equal, round, and reactive to light.  Neck: Normal range of motion.  Cardiovascular: Normal rate.   Respiratory: Effort normal.  GI: Soft.  Genitourinary: Vagina normal.  Musculoskeletal: Normal range of motion.  Neurological: She is alert and oriented to person, place, and time. She has normal reflexes.  Skin: Skin is warm and dry.  Generalized edema, significant non pitting bilateral lower extremity  edema  Psychiatric: She has a normal mood and affect. Her behavior is normal. Judgment and thought content normal.   SVE:  2/80/-2 FT:   Cat 1 tracing, 130-135 bpm, moderate variability, +accels, No decels UC:  Irregular contractions  Membranes intact Vertex presentation by vaginal exam, leopolds    ED Course  Assessment: IUP 36.2 wks Elevated BP Generalized non pitting edema PIH labs wnl, PCR 0.20 Braxton Hicks contractions  Plan: DC Home  Preterm labor precautions Pre_eclampsia precautions- advised to call if she has a HA that does not resolve with tylenol, hydration and rest; changes in vision, and/or epigastric pain Per consult with Dr. Su Hiltoberts, MD  Pt to follow up in [facility] office on Monday and Thursday for NST, Blood pressure check, and repeat labs Recommended to obtain compression stockings that fit and  elevate/rest legs as much as possible Call PRN   Alphonzo Severance CNM,  MSN 05/20/2015 9:52 PM

## 2015-05-26 ENCOUNTER — Inpatient Hospital Stay (HOSPITAL_COMMUNITY): Payer: Medicaid Other

## 2015-05-26 ENCOUNTER — Inpatient Hospital Stay (HOSPITAL_COMMUNITY)
Admission: AD | Admit: 2015-05-26 | Discharge: 2015-05-26 | Disposition: A | Discharge status: Home / Self Care | Payer: Medicaid Other | Source: Ambulatory Visit | Attending: Obstetrics and Gynecology | Admitting: Obstetrics and Gynecology

## 2015-05-26 ENCOUNTER — Encounter (HOSPITAL_COMMUNITY): Payer: Self-pay

## 2015-05-26 DIAGNOSIS — Z3A37 37 weeks gestation of pregnancy: Secondary | ICD-10-CM | POA: Diagnosis not present

## 2015-05-26 DIAGNOSIS — O99820 Streptococcus B carrier state complicating pregnancy: Secondary | ICD-10-CM | POA: Diagnosis present

## 2015-05-26 DIAGNOSIS — Z8049 Family history of malignant neoplasm of other genital organs: Secondary | ICD-10-CM | POA: Diagnosis not present

## 2015-05-26 DIAGNOSIS — O26893 Other specified pregnancy related conditions, third trimester: Secondary | ICD-10-CM | POA: Diagnosis not present

## 2015-05-26 DIAGNOSIS — R609 Edema, unspecified: Secondary | ICD-10-CM | POA: Insufficient documentation

## 2015-05-26 DIAGNOSIS — O163 Unspecified maternal hypertension, third trimester: Secondary | ICD-10-CM | POA: Insufficient documentation

## 2015-05-26 DIAGNOSIS — O99213 Obesity complicating pregnancy, third trimester: Secondary | ICD-10-CM | POA: Insufficient documentation

## 2015-05-26 DIAGNOSIS — Z6841 Body Mass Index (BMI) 40.0 and over, adult: Secondary | ICD-10-CM | POA: Insufficient documentation

## 2015-05-26 DIAGNOSIS — Z91030 Bee allergy status: Secondary | ICD-10-CM | POA: Diagnosis not present

## 2015-05-26 DIAGNOSIS — O1203 Gestational edema, third trimester: Secondary | ICD-10-CM

## 2015-05-26 DIAGNOSIS — R51 Headache: Secondary | ICD-10-CM | POA: Diagnosis not present

## 2015-05-26 DIAGNOSIS — IMO0002 Reserved for concepts with insufficient information to code with codable children: Secondary | ICD-10-CM

## 2015-05-26 LAB — URINALYSIS, ROUTINE W REFLEX MICROSCOPIC
BILIRUBIN URINE: NEGATIVE
Glucose, UA: NEGATIVE mg/dL
Ketones, ur: NEGATIVE mg/dL
NITRITE: NEGATIVE
PH: 6 (ref 5.0–8.0)
Protein, ur: 30 mg/dL — AB
SPECIFIC GRAVITY, URINE: 1.02 (ref 1.005–1.030)

## 2015-05-26 LAB — CBC
HCT: 33.2 % — ABNORMAL LOW (ref 36.0–46.0)
HEMOGLOBIN: 11.3 g/dL — AB (ref 12.0–15.0)
MCH: 27.2 pg (ref 26.0–34.0)
MCHC: 34 g/dL (ref 30.0–36.0)
MCV: 80 fL (ref 78.0–100.0)
PLATELETS: 198 10*3/uL (ref 150–400)
RBC: 4.15 MIL/uL (ref 3.87–5.11)
RDW: 13.7 % (ref 11.5–15.5)
WBC: 9.8 10*3/uL (ref 4.0–10.5)

## 2015-05-26 LAB — COMPREHENSIVE METABOLIC PANEL
ALBUMIN: 2.7 g/dL — AB (ref 3.5–5.0)
ALK PHOS: 120 U/L (ref 38–126)
ALT: 17 U/L (ref 14–54)
AST: 21 U/L (ref 15–41)
Anion gap: 9 (ref 5–15)
CHLORIDE: 108 mmol/L (ref 101–111)
CO2: 22 mmol/L (ref 22–32)
CREATININE: 0.53 mg/dL (ref 0.44–1.00)
Calcium: 9.1 mg/dL (ref 8.9–10.3)
GFR calc non Af Amer: >60 mL/min (ref >60)
GLUCOSE: 147 mg/dL — AB (ref 65–99)
Potassium: 3.6 mmol/L (ref 3.5–5.1)
SODIUM: 139 mmol/L (ref 135–145)
Total Bilirubin: 0.2 mg/dL — ABNORMAL LOW (ref 0.3–1.2)
Total Protein: 6.1 g/dL — ABNORMAL LOW (ref 6.5–8.1)

## 2015-05-26 LAB — URINE MICROSCOPIC-ADD ON

## 2015-05-26 LAB — LACTATE DEHYDROGENASE: LDH: 156 U/L (ref 98–192)

## 2015-05-26 LAB — PROTEIN / CREATININE RATIO, URINE
Creatinine, Urine: 207 mg/dL
Protein Creatinine Ratio: 0.25 mg/mg{Cre} — ABNORMAL HIGH (ref 0.00–0.15)
TOTAL PROTEIN, URINE: 51 mg/dL

## 2015-05-26 LAB — URIC ACID: Uric Acid, Serum: 5 mg/dL (ref 2.3–6.6)

## 2015-05-26 MED ORDER — ZOLPIDEM TARTRATE ER 12.5 MG PO TBCR: 12.5000 mg | EXTENDED_RELEASE_TABLET | Freq: Every evening | ORAL | Status: DC | PRN

## 2015-05-26 MED ORDER — ACETAMINOPHEN 325 MG PO TABS
650.0000 mg | ORAL_TABLET | Freq: Four times a day (QID) | ORAL | Status: DC | PRN
  Administered 2015-05-26: 650 mg via ORAL
  Filled 2015-05-26: qty 2

## 2015-05-26 NOTE — MAU Note (Signed)
Pt reports she was sent from office for elevated b/p and contractions

## 2015-05-26 NOTE — Discharge Instructions (Signed)

## 2015-05-26 NOTE — MAU Provider Note (Signed)
Regina Nelson, Regina Nelson is a 24yo, G1P0, 37.1 wks presenting to MAU announced for further evaluation after her Return OB [facility] earlier today.  She was sent to MAU for elevated BP, contractions, and significant non pitting generalized edema..    Also complains of not sleeping.  +FM, no lof, no vb.  Reports Headaches on/off and  lack of appetite. Admits she has not eaten today.   Denies blurry vision or epigastric pain.   She was previously seen in MAU on 05/20/15 for significant generalized edema, especially bilaterally lower extremities, that made it painful to move/walk.     History     Patient Active Problem List   Diagnosis Date Noted  . Morbid obesity with BMI of 40.0-44.9, adult (HCC) 05/20/2015  . Diastasis recti 05/20/2015  . Hx of peptic ulcer 05/20/2015  . Positive GBS test 05/20/2015    Chief Complaint  Patient presents with  . Hypertension  . Labor Eval   HPI  OB History    Gravida Para Term Preterm AB TAB SAB Ectopic Multiple Living   1               Past Medical History  Diagnosis Date  . Degenerative disc disease   . STD (female)   . Degenerative joint disease of spine   . Hernia of abdominal wall     Past Surgical History  Procedure Laterality Date  . Cholecystectomy    . Wisdom tooth extraction      Family History  Problem Relation Age of Onset  . Colon cancer Neg Hx   . Cervical cancer Maternal Aunt     Social History  Substance Use Topics  . Smoking status: Never Smoker   . Smokeless tobacco: Never Used  . Alcohol Use: No     Comment: socially    Allergies:  Allergies  Allergen Reactions  . Bee Venom Anaphylaxis    Prescriptions prior to admission  Medication Sig Dispense Refill Last Dose  . calcium carbonate (TUMS - DOSED IN MG ELEMENTAL CALCIUM) 500 MG chewable tablet Chew 1 tablet by mouth as needed for indigestion or heartburn. Patient takes 1 tablet 6-7 times daily.   05/26/2015 at Unknown time  . Prenatal  Vit-Fe Fumarate-FA (PRENATAL MULTIVITAMIN) TABS tablet Take 1 tablet by mouth daily at 12 noon.   Past Week at Unknown time    ROS Physical Exam   Blood pressure 142/87, pulse 77, temperature 98.2 F (36.8 C), temperature source Oral, resp. rate 18, height 5\' 6"  (1.676 m), weight 123.378 kg (272 lb), SpO2 98 %.  Filed Vitals:   05/26/15 1422 05/26/15 1427 05/26/15 1657 05/26/15 2005  BP:   125/76 142/87  Pulse: 82 78 87 77  Temp:      TempSrc:      Resp:   18   Height:      Weight:      SpO2: 99% 98%      Results for orders placed or performed during the hospital encounter of 05/26/15 (from the past 24 hour(s))  Protein / creatinine ratio, urine     Status: Abnormal   Collection Time: 05/26/15 11:27 AM  Result Value Ref Range   Creatinine, Urine 207.00 mg/dL   Total Protein, Urine 51 mg/dL   Protein Creatinine Ratio 0.25 (H) 0.00 - 0.15 mg/mg[Cre]  Urinalysis, Routine w reflex microscopic (not at [facility])     Status: Abnormal   Collection Time: 05/26/15 11:27 AM  Result Value Ref Range  Color, Urine YELLOW YELLOW   APPearance HAZY (A) CLEAR   Specific Gravity, Urine 1.020 1.005 - 1.030   pH 6.0 5.0 - 8.0   Glucose, UA NEGATIVE NEGATIVE mg/dL   Hgb urine dipstick TRACE (A) NEGATIVE   Bilirubin Urine NEGATIVE NEGATIVE   Ketones, ur NEGATIVE NEGATIVE mg/dL   Protein, ur 30 (A) NEGATIVE mg/dL   Nitrite NEGATIVE NEGATIVE   Leukocytes, UA TRACE (A) NEGATIVE  Urine microscopic-add on     Status: Abnormal   Collection Time: 05/26/15 11:27 AM  Result Value Ref Range   Squamous Epithelial / LPF 0-5 (A) NONE SEEN   WBC, UA 0-5 0 - 5 WBC/hpf   RBC / HPF 0-5 0 - 5 RBC/hpf   Bacteria, UA RARE (A) NONE SEEN  CBC     Status: Abnormal   Collection Time: 05/26/15 12:15 PM  Result Value Ref Range   WBC 9.8 4.0 - 10.5 K/uL   RBC 4.15 3.87 - 5.11 MIL/uL   Hemoglobin 11.3 (L) 12.0 - 15.0 g/dL   HCT 16.1 (L) 09.6 - 04.5 %   MCV 80.0 78.0 - 100.0 fL   MCH 27.2 26.0 - 34.0 pg   MCHC  34.0 30.0 - 36.0 g/dL   RDW 40.9 81.1 - 91.4 %   Platelets 198 150 - 400 K/uL  Comprehensive metabolic panel     Status: Abnormal   Collection Time: 05/26/15 12:15 PM  Result Value Ref Range   Sodium 139 135 - 145 mmol/L   Potassium 3.6 3.5 - 5.1 mmol/L   Chloride 108 101 - 111 mmol/L   CO2 22 22 - 32 mmol/L   Glucose, Bld 147 (H) 65 - 99 mg/dL   BUN <5 (L) 6 - 20 mg/dL   Creatinine, Ser 7.82 0.44 - 1.00 mg/dL   Calcium 9.1 8.9 - 95.6 mg/dL   Total Protein 6.1 (L) 6.5 - 8.1 g/dL   Albumin 2.7 (L) 3.5 - 5.0 g/dL   AST 21 15 - 41 U/L   ALT 17 14 - 54 U/L   Alkaline Phosphatase 120 38 - 126 U/L   Total Bilirubin 0.2 (L) 0.3 - 1.2 mg/dL   GFR calc non Af Amer >60 >60 mL/min   GFR calc Af Amer >60 >60 mL/min   Anion gap 9 5 - 15  Lactate dehydrogenase     Status: None   Collection Time: 05/26/15 12:15 PM  Result Value Ref Range   LDH 156 98 - 192 U/L  Uric acid     Status: None   Collection Time: 05/26/15 12:15 PM  Result Value Ref Range   Uric Acid, Serum 5.0 2.3 - 6.6 mg/dL    Dilation: 3.5 Effacement (%): 80 Cervical Position: Anterior Station: -1 Presentation: Vertex Exam by:: Oval Cavazos,cnm  FHT:  Cat 1, 125 bpm, moderate variability, +accels, no decels UC:  Irregular, every 5-7 min Membranes: intact  US Findings:   EFW >90%  (3966),  BPP: 8/8, Normal AFI,  Posterior placenta without previa,    Physical Exam  ED Course  Assessment: IUP 37.1 wks Severe Generalized edema, non pitting Headache  Elevated BP Irregular contractions PIH wnl, PCR 0.25  Interrum Plan: Diet regular, encouraged to eat Tylenol for HA [place] for EFW/BPP Monitor and recheck cervix when finished with [place]  Consult Dr. Estanislado Pandy  Final Plan  Cervix recheck 3.5/80/-1 Not in labor DC home Rx Ambien for sleep Go to ROB on Tuesday next week  For NST, PIH labs and  BP check    Alphonzo Severance CNM, MSN 05/26/2015 8:07 PM

## 2015-05-31 ENCOUNTER — Inpatient Hospital Stay (HOSPITAL_COMMUNITY): Payer: Medicaid Other | Admitting: Anesthesiology

## 2015-05-31 ENCOUNTER — Encounter (HOSPITAL_COMMUNITY): Payer: Self-pay

## 2015-05-31 ENCOUNTER — Inpatient Hospital Stay (HOSPITAL_COMMUNITY)
Admission: AD | Admit: 2015-05-31 | Discharge: 2015-06-04 | DRG: 765 | Disposition: A | Discharge status: Home / Self Care | Payer: Medicaid Other | Source: Ambulatory Visit | Attending: Obstetrics and Gynecology | Admitting: Obstetrics and Gynecology

## 2015-05-31 DIAGNOSIS — O9081 Anemia of the puerperium: Secondary | ICD-10-CM | POA: Diagnosis not present

## 2015-05-31 DIAGNOSIS — O134 Gestational [pregnancy-induced] hypertension without significant proteinuria, complicating childbirth: Secondary | ICD-10-CM | POA: Diagnosis present

## 2015-05-31 DIAGNOSIS — O3663X0 Maternal care for excessive fetal growth, third trimester, not applicable or unspecified: Secondary | ICD-10-CM | POA: Diagnosis present

## 2015-05-31 DIAGNOSIS — O99824 Streptococcus B carrier state complicating childbirth: Secondary | ICD-10-CM | POA: Diagnosis present

## 2015-05-31 DIAGNOSIS — O139 Gestational [pregnancy-induced] hypertension without significant proteinuria, unspecified trimester: Secondary | ICD-10-CM | POA: Diagnosis present

## 2015-05-31 DIAGNOSIS — D649 Anemia, unspecified: Secondary | ICD-10-CM | POA: Diagnosis not present

## 2015-05-31 DIAGNOSIS — O1404 Mild to moderate pre-eclampsia, complicating childbirth: Secondary | ICD-10-CM | POA: Diagnosis present

## 2015-05-31 DIAGNOSIS — Z8049 Family history of malignant neoplasm of other genital organs: Secondary | ICD-10-CM | POA: Diagnosis not present

## 2015-05-31 DIAGNOSIS — O1493 Unspecified pre-eclampsia, third trimester: Secondary | ICD-10-CM

## 2015-05-31 DIAGNOSIS — Z3A37 37 weeks gestation of pregnancy: Secondary | ICD-10-CM

## 2015-05-31 DIAGNOSIS — O324XX0 Maternal care for high head at term, not applicable or unspecified: Secondary | ICD-10-CM | POA: Diagnosis present

## 2015-05-31 DIAGNOSIS — Z98891 History of uterine scar from previous surgery: Secondary | ICD-10-CM

## 2015-05-31 DIAGNOSIS — Z8711 Personal history of peptic ulcer disease: Secondary | ICD-10-CM

## 2015-05-31 DIAGNOSIS — O99214 Obesity complicating childbirth: Secondary | ICD-10-CM | POA: Diagnosis present

## 2015-05-31 DIAGNOSIS — Z6841 Body Mass Index (BMI) 40.0 and over, adult: Secondary | ICD-10-CM | POA: Diagnosis not present

## 2015-05-31 LAB — COMPREHENSIVE METABOLIC PANEL
ALBUMIN: 2.7 g/dL — AB (ref 3.5–5.0)
ALK PHOS: 115 U/L (ref 38–126)
ALT: 14 U/L (ref 14–54)
ANION GAP: 9 (ref 5–15)
AST: 20 U/L (ref 15–41)
BILIRUBIN TOTAL: 0.3 mg/dL (ref 0.3–1.2)
BUN: 7 mg/dL (ref 6–20)
CALCIUM: 9.4 mg/dL (ref 8.9–10.3)
CO2: 19 mmol/L — ABNORMAL LOW (ref 22–32)
Chloride: 108 mmol/L (ref 101–111)
Creatinine, Ser: 0.5 mg/dL (ref 0.44–1.00)
Glucose, Bld: 136 mg/dL — ABNORMAL HIGH (ref 65–99)
POTASSIUM: 3.4 mmol/L — AB (ref 3.5–5.1)
Sodium: 136 mmol/L (ref 135–145)
TOTAL PROTEIN: 6.1 g/dL — AB (ref 6.5–8.1)

## 2015-05-31 LAB — PROTEIN / CREATININE RATIO, URINE
CREATININE, URINE: 76 mg/dL
Protein Creatinine Ratio: 0.32 mg/mg{Cre} — ABNORMAL HIGH (ref 0.00–0.15)
TOTAL PROTEIN, URINE: 24 mg/dL

## 2015-05-31 LAB — CBC
HCT: 32.2 % — ABNORMAL LOW (ref 36.0–46.0)
HEMOGLOBIN: 10.8 g/dL — AB (ref 12.0–15.0)
MCH: 26.8 pg (ref 26.0–34.0)
MCHC: 33.5 g/dL (ref 30.0–36.0)
MCV: 79.9 fL (ref 78.0–100.0)
Platelets: 189 10*3/uL (ref 150–400)
RBC: 4.03 MIL/uL (ref 3.87–5.11)
RDW: 13.6 % (ref 11.5–15.5)
WBC: 10.1 10*3/uL (ref 4.0–10.5)

## 2015-05-31 LAB — TYPE AND SCREEN
ABO/RH(D): O POS
ANTIBODY SCREEN: NEGATIVE

## 2015-05-31 MED ORDER — OXYTOCIN BOLUS FROM INFUSION: 500.0000 mL | INTRAVENOUS | Status: DC

## 2015-05-31 MED ORDER — PENICILLIN G POTASSIUM 5000000 UNITS IJ SOLR
5.0000 10*6.[IU] | Freq: Once | INTRAVENOUS | Status: AC
  Administered 2015-05-31: 5 10*6.[IU] via INTRAVENOUS
  Filled 2015-05-31: qty 5

## 2015-05-31 MED ORDER — EPHEDRINE 5 MG/ML INJ: 10.0000 mg | INTRAVENOUS | Status: DC | PRN

## 2015-05-31 MED ORDER — DIPHENHYDRAMINE HCL 50 MG/ML IJ SOLN: 12.5000 mg | INTRAMUSCULAR | Status: DC | PRN

## 2015-05-31 MED ORDER — PENICILLIN G POTASSIUM 5000000 UNITS IJ SOLR
2.5000 10*6.[IU] | INTRAVENOUS | Status: DC
  Administered 2015-05-31 – 2015-06-01 (×3): 2.5 10*6.[IU] via INTRAVENOUS
  Filled 2015-05-31 (×6): qty 2.5

## 2015-05-31 MED ORDER — LACTATED RINGERS IV SOLN
INTRAVENOUS | Status: DC
  Administered 2015-05-31 (×2): via INTRAVENOUS

## 2015-05-31 MED ORDER — FENTANYL CITRATE (PF) 100 MCG/2ML IJ SOLN
50.0000 ug | INTRAMUSCULAR | Status: DC | PRN
  Administered 2015-05-31 (×2): 100 ug via INTRAVENOUS
  Filled 2015-05-31 (×2): qty 2

## 2015-05-31 MED ORDER — LIDOCAINE HCL (PF) 1 % IJ SOLN
INTRAMUSCULAR | Status: DC | PRN
  Administered 2015-05-31 (×2): 8 mL via EPIDURAL

## 2015-05-31 MED ORDER — HYDRALAZINE HCL 20 MG/ML IJ SOLN: 10.0000 mg | Freq: Once | INTRAMUSCULAR | Status: DC | PRN

## 2015-05-31 MED ORDER — OXYTOCIN 10 UNIT/ML IJ SOLN
1.0000 m[IU]/min | INTRAMUSCULAR | Status: DC
  Administered 2015-05-31: 2 m[IU]/min via INTRAVENOUS

## 2015-05-31 MED ORDER — TERBUTALINE SULFATE 1 MG/ML IJ SOLN: 0.2500 mg | Freq: Once | INTRAMUSCULAR | Status: DC | PRN

## 2015-05-31 MED ORDER — CITRIC ACID-SODIUM CITRATE 334-500 MG/5ML PO SOLN
30.0000 mL | ORAL | Status: DC | PRN
  Administered 2015-06-01: 30 mL via ORAL
  Filled 2015-05-31: qty 15

## 2015-05-31 MED ORDER — LIDOCAINE HCL (PF) 1 % IJ SOLN
30.0000 mL | INTRAMUSCULAR | Status: DC | PRN
  Filled 2015-05-31: qty 30

## 2015-05-31 MED ORDER — PHENYLEPHRINE 40 MCG/ML (10ML) SYRINGE FOR IV PUSH (FOR BLOOD PRESSURE SUPPORT)
80.0000 ug | PREFILLED_SYRINGE | INTRAVENOUS | Status: DC | PRN
  Filled 2015-05-31: qty 20

## 2015-05-31 MED ORDER — PHENYLEPHRINE 40 MCG/ML (10ML) SYRINGE FOR IV PUSH (FOR BLOOD PRESSURE SUPPORT): 80.0000 ug | PREFILLED_SYRINGE | INTRAVENOUS | Status: DC | PRN

## 2015-05-31 MED ORDER — ACETAMINOPHEN 325 MG PO TABS
650.0000 mg | ORAL_TABLET | ORAL | Status: DC | PRN
  Administered 2015-06-01: 650 mg via ORAL
  Filled 2015-05-31: qty 2

## 2015-05-31 MED ORDER — FENTANYL 2.5 MCG/ML BUPIVACAINE 1/10 % EPIDURAL INFUSION (WH - ANES)
14.0000 mL/h | INTRAMUSCULAR | Status: DC | PRN
  Administered 2015-05-31 – 2015-06-01 (×2): 14 mL/h via EPIDURAL
  Filled 2015-05-31 (×2): qty 125

## 2015-05-31 MED ORDER — OXYTOCIN 10 UNIT/ML IJ SOLN
2.5000 [IU]/h | INTRAVENOUS | Status: DC
  Filled 2015-05-31: qty 4

## 2015-05-31 MED ORDER — LABETALOL HCL 5 MG/ML IV SOLN
20.0000 mg | INTRAVENOUS | Status: DC | PRN
Start: 2015-05-31 — End: 2015-06-01

## 2015-05-31 MED ORDER — LACTATED RINGERS IV SOLN: 500.0000 mL | Freq: Once | INTRAVENOUS | Status: DC

## 2015-05-31 MED ORDER — LACTATED RINGERS IV SOLN: 500.0000 mL | INTRAVENOUS | Status: DC | PRN

## 2015-05-31 MED ORDER — ONDANSETRON HCL 4 MG/2ML IJ SOLN
4.0000 mg | Freq: Four times a day (QID) | INTRAMUSCULAR | Status: DC | PRN
  Administered 2015-05-31: 4 mg via INTRAVENOUS
  Filled 2015-05-31: qty 2

## 2015-05-31 NOTE — Progress Notes (Signed)
Regina Nelson MRN: [id number]  Subjective: -Nurse call and reports SROM of clear fluid.  However, states patient passing some dime sized clots.  In room to assess and discuss labor process.  Family at bedside, supportive.  Objective: BP 148/93 mmHg  Pulse 87  Temp(Src) 98.2 F (36.8 C) (Oral)  Resp 20  Ht '5\' 6"'  (1.676 m)  Wt 124.286 kg (274 lb)  BMI 44.25 kg/m2  LMP  (LMP Unknown)     Results for orders placed or performed during the hospital encounter of 05/31/15 (from the past 24 hour(s))  CBC     Status: Abnormal   Collection Time: 05/31/15 11:44 AM  Result Value Ref Range   WBC 10.1 4.0 - 10.5 K/uL   RBC 4.03 3.87 - 5.11 MIL/uL   Hemoglobin 10.8 (L) 12.0 - 15.0 g/dL   HCT 32.2 (L) 36.0 - 46.0 %   MCV 79.9 78.0 - 100.0 fL   MCH 26.8 26.0 - 34.0 pg   MCHC 33.5 30.0 - 36.0 g/dL   RDW 13.6 11.5 - 15.5 %   Platelets 189 150 - 400 K/uL  Comprehensive metabolic panel     Status: Abnormal   Collection Time: 05/31/15 11:44 AM  Result Value Ref Range   Sodium 136 135 - 145 mmol/L   Potassium 3.4 (L) 3.5 - 5.1 mmol/L   Chloride 108 101 - 111 mmol/L   CO2 19 (L) 22 - 32 mmol/L   Glucose, Bld 136 (H) 65 - 99 mg/dL   BUN 7 6 - 20 mg/dL   Creatinine, Ser 0.50 0.44 - 1.00 mg/dL   Calcium 9.4 8.9 - 10.3 mg/dL   Total Protein 6.1 (L) 6.5 - 8.1 g/dL   Albumin 2.7 (L) 3.5 - 5.0 g/dL   AST 20 15 - 41 U/L   ALT 14 14 - 54 U/L   Alkaline Phosphatase 115 38 - 126 U/L   Total Bilirubin 0.3 0.3 - 1.2 mg/dL   GFR calc non Af Amer >60 >60 mL/min   GFR calc Af Amer >60 >60 mL/min   Anion gap 9 5 - 15  Type and screen [place]     Status: None   Collection Time: 05/31/15 11:46 AM  Result Value Ref Range   ABO/RH(D) O POS    Antibody Screen NEG    Sample Expiration 06/03/2015   Protein / creatinine ratio, urine     Status: Abnormal   Collection Time: 05/31/15 12:07 PM  Result Value Ref Range   Creatinine, Urine 76.00 mg/dL   Total Protein, Urine 24  mg/dL   Protein Creatinine Ratio 0.32 (H) 0.00 - 0.15 mg/mg[Cre]     Fetal Monitoring: FHT: 125 bpm, Mod Var, -Decels, +Accels UC: Qmin, palpates moderate    Vaginal Exam: SVE:   Dilation: 4 Effacement (%): 70 Station: -3 Exam by:: Damyon Mullane, Jwessica CNM Membranes:SROM of clear fluid at 1400 Internal Monitors: IUPC inserted without difficulty, however resistance met with flushing  Augmentation/Induction: Pitocin:10mn/min Cytotec: None  Assessment:  IUP at 37.6wks Cat I FT  SROM IOL PreEclampsia  Plan: -Reassurances given regarding vaginal bleeding -Discussed IUPC r/b, prior to insertion, including increased risk of infection and ability to adequately monitor quantity and strength of contractions -Decrease pitocin to 675m/min -Okay for pain medication as desired -Continue other mgmt as ordered  JeRiley ChurchesCNM 05/31/2015, 3:00 PM

## 2015-05-31 NOTE — Anesthesia Procedure Notes (Signed)
Epidural Patient location during procedure: OB Start time: 05/31/2015 5:02 PM End time: 05/31/2015 5:06 PM  Staffing Anesthesiologist: Leilani AbleHATCHETT, Tanina Barb Performed by: anesthesiologist   Preanesthetic Checklist Completed: patient identified, surgical consent, pre-op evaluation, timeout performed, IV checked, risks and benefits discussed and monitors and equipment checked  Epidural Patient position: sitting Prep: site prepped and draped and DuraPrep Patient monitoring: continuous pulse ox and blood pressure Approach: midline Location: L3-L4 Injection technique: LOR air  Needle:  Needle type: Tuohy  Needle gauge: 17 G Needle length: 9 cm and 9 Needle insertion depth: 7 cm Catheter type: closed end flexible Catheter size: 19 Gauge Catheter at skin depth: 12 cm Test dose: negative and Other  Assessment Sensory level: T9 Events: blood not aspirated, injection not painful, no injection resistance, negative IV test and no paresthesia  Additional Notes Reason for block:procedure for pain

## 2015-05-31 NOTE — Progress Notes (Signed)
Subjective: Assumed care. In to greet patient and family.  Pt resting in bed, reports feeling more pelvic and rectal pressure.   Family at bedside for support.   Objective: BP 127/72 mmHg  Pulse 97  Temp(Src) 98.4 F (36.9 C) (Oral)  Resp 18  Ht 5\' 6"  (1.676 m)  Wt 124.286 kg (274 lb)  BMI 44.25 kg/m2  SpO2 99%  LMP  (LMP Unknown)      Filed Vitals:   05/31/15 1931 05/31/15 2001  BP: 124/84 127/72  Pulse: 97 97  Temp:    Resp: 18 18    FHT: Category 1, 130 bpm, moderate variability, +accels, no decels UC:   regular, every 1-4 minutes SVE:   Dilation: 6.5 Effacement (%): 90 Station: 0 Exam by:: Fleet Contrasachel, CNM  Membranes:SROM x 1408 on 05/31/15 Internal Monitors: IUPC in place and functional  Augmentation/Induction: Pitocin:366mUn/min Cytotec: None   Assessment:  IUP at 37.6wks Cat I FT  Pitocin Augmentation GBS Positive PreEclampsia  Plan: Continue pitocin at 656mUn/min considering contraction frequency -Position change to promote fetal descent and rotation -Monitor BP as appropriate-stable at current -Continue other mgmt as ordered  Regina Nelson CNM, [place] 05/31/2015, 8:30 PM

## 2015-05-31 NOTE — Progress Notes (Signed)
Regina Nelson MRN: [id number]  Subjective: -Patient reports comfort s/p epidural.  Attempting to sleep.  Reports some nausea.  Denies needs. Family at bedside, supportive.  Objective: BP 123/80 mmHg  Pulse 89  Temp(Src) 97.8 F (36.6 C) (Oral)  Resp 20  Ht 5\' 6"  (1.676 m)  Wt 124.286 kg (274 lb)  BMI 44.25 kg/m2  SpO2 99%  LMP  (LMP Unknown)      Fetal Monitoring: FHT: 125 bpm, Mod Var, -Decels, +Accels UC: Qmin, MVUs 170mmHg    Vaginal Exam: SVE:   Dilation: 4 Effacement (%): 70 Station: -3 Exam by:: Mcihael Hinderman, Jwessica CNM Membranes:SROM x  Internal Monitors: IUPC in place and functional  Augmentation/Induction: Pitocin:476mUn/min Cytotec: None  Assessment:  IUP at 37.6wks Cat I FT  Pitocin Augmentation GBS Positive PreEclampsia Nausea  Plan: -Continue pitocin at 466mUn/min considering contraction frequency -Position change to promote fetal descent and rotation -Zofran for nausea -Monitor BP as appropriate-stable at current -Continue other mgmt as ordered  Regina CavaJessica L Patina Spanier,MSN, CNM 05/31/2015, 5:55 PM

## 2015-05-31 NOTE — Anesthesia Preprocedure Evaluation (Signed)
Anesthesia Evaluation  Patient identified by MRN, date of birth, ID band Patient awake    Reviewed: Allergy & Precautions, H&P , NPO status , Patient's Chart, lab work & pertinent test results  Airway Mallampati: III  TM Distance: >3 FB Neck ROM: full    Dental no notable dental hx.    Pulmonary neg pulmonary ROS,    Pulmonary exam normal        Cardiovascular hypertension, Normal cardiovascular exam     Neuro/Psych negative psych ROS   GI/Hepatic negative GI ROS, Neg liver ROS,   Endo/Other  Morbid obesity  Renal/GU negative Renal ROS     Musculoskeletal   Abdominal (+) + obese,   Peds  Hematology negative hematology ROS (+)   Anesthesia Other Findings   Reproductive/Obstetrics (+) Pregnancy                             Anesthesia Physical Anesthesia Plan  ASA: III  Anesthesia Plan: Epidural   Post-op Pain Management:    Induction:   Airway Management Planned:   Additional Equipment:   Intra-op Plan:   Post-operative Plan:   Informed Consent: I have reviewed the patients History and Physical, chart, labs and discussed the procedure including the risks, benefits and alternatives for the proposed anesthesia with the patient or authorized representative who has indicated his/her understanding and acceptance.     Plan Discussed with:   Anesthesia Plan Comments:         Anesthesia Quick Evaluation

## 2015-05-31 NOTE — Progress Notes (Signed)
Subjective: Called to room. Feeling rectal pressure.   Objective: BP 124/68 mmHg  Pulse 91  Temp(Src) 98.5 F (36.9 C) (Oral)  Resp 18  Ht 5\' 6"  (1.676 m)  Wt 124.286 kg (274 lb)  BMI 44.25 kg/m2  SpO2 99%  LMP  (LMP Unknown)      FHT: Category 1, 130 bpm, moderate variability, +accels, no decels UC:   regular, every 2-3 minutes SVE:   Dilation: Lip/rim Effacement (%): 100 Station: +1 Exam by:: Delainey Winstanley, CNM  Membranes:SROM x 1408 on 05/31/15 Internal Monitors: IUPC, MVu 160-180  Augmentation/Induction: Pitocin:256mUn/min Cytotec: None  Assessment:  IUP at 37.6wks Cat I FT  Pitocin Augmentation LGA baby  GBS Positive PreEclampsia  Plan: Anticipate SVD Dr.  Normand Sloopillard notified of cervical dilation Begin to push with patient Large baby anticipated, Dr. Normand Sloopillard available at all times for delivery  Alphonzo Severanceachel Haily Caley CNM, [place] 05/31/2015, 11:20 PM

## 2015-05-31 NOTE — H&P (Signed)
Regina Nelson is a [age] y.o. female, G1P0 at 37.6 weeks, presenting for IOL secondary to PIH with HA, edema, and visual disturbances.  Patient pregnancy history has been also significant for obesity with 68lb weight gain, LGA infant, and GBS Positive status.  Patient reports desire for IV pain medication and reports loss of consciousness with nitrous usage during a dental procedure.   Patient Active Problem List   Diagnosis Date Noted  . Pregnancy induced hypertension 05/31/2015  . Morbid obesity with BMI of 40.0-44.9, adult (HCC) 05/20/2015  . Diastasis recti 05/20/2015  . Hx of peptic ulcer 05/20/2015  . Positive GBS test 05/20/2015    History of present pregnancy: Patient entered care at 12.6 weeks for NOB interview and 13.6 weeks for NOBWU.   EDC of 06/15/2015 was established by Definite LMP of 09/08/2014 and consistent with 5.6wk US on 10/19/2014.   Anatomy scan:  19.6 weeks, with normal findings and an posterior placenta.   Additional US evaluations:  U/S: Singleton pregnancy. Vertex presentation. Cervix closed-measured transabdominally 3.9cm. Posterior placenta -placenta edge 3.7cm from internal os. Fluid is normal. Profile, ND, Orbits, Diaphragm, Renal arteries-seen. RVOT/3VV not well visualized due to fetal position. Adnexas/ovaries unremarkable. 24wks: ultrasound today showed a single gestation, vertex presentation, normal fluid, normal anatomy, normal placenta, growth is 40 %, cervix is 3.72 cm.  31.5wks:  GROWTH US: SIUP, VTX, 2001G (4LB 70Z) 66%, ANT. PLACENTA, AFI NORMAL 55%. 37.1wk: BPP: 8/8, EFW 90% 3966G, Normal AFI, Posterior placenta without previa, Cephalic; PIH labs wnl, PCR.25 Significant prenatal events: 1st Trimester:  2nd Trimester: Reports planned pregnancy. C/O swelling in legs and abdominal pain. Patient c/o leg pain that radiates into thighs.  Evaluated in MAU for vaginal discharge.  3rd Trimester:   Continued swelling of legs/feet.  One hr GTT abnormal, 3hr  GTT  With elevated fasting. Patient c/o contractions. Patient evaluated for elevated BP  Last evaluation:  05/31/2015 in office by Dr. ND.  VE 3/70/-2  BP 140/80, Wt 274lbs, TWG 68lbs  OB History    Gravida Para Term Preterm AB TAB SAB Ectopic Multiple Living   1              Past Medical History  Diagnosis Date  . Degenerative disc disease   . STD (female)   . Degenerative joint disease of spine   . Hernia of abdominal wall    Past Surgical History  Procedure Laterality Date  . Cholecystectomy    . Wisdom tooth extraction     Family History: family history includes Cervical cancer in her maternal aunt. There is no history of Colon cancer. Social History:  reports that she has never smoked. She has never used smokeless tobacco. She reports that she does not drink alcohol or use illicit drugs.   Prenatal Transfer Tool  Maternal Diabetes: No Genetic Screening: Normal Maternal Ultrasounds/Referrals: Normal Fetal Ultrasounds or other Referrals:  None Maternal Substance Abuse:  No Significant Maternal Medications:  None Significant Maternal Lab Results: Lab values include: Group B Strep positive    ROS:  +HA, +Visual Disturbance, +FM, +Ctx, +Edema -LOF, -VB No recent illness No GI Concerns   Allergies  Allergen Reactions  . Bee Venom Anaphylaxis       Temperature 98.7 F (37.1 C), temperature source Oral, resp. rate 20, height 5\' 6"  (1.676 m), weight 124.286 kg (274 lb).  Physical Exam  Vitals reviewed. Constitutional: She is oriented to person, place, and time. She appears well-developed and well-nourished. No distress.  HENT:  Head: Normocephalic and atraumatic.  Eyes: Conjunctivae are normal.  Neck: Normal range of motion.  Cardiovascular: Normal rate, regular rhythm and normal heart sounds.   Respiratory: Effort normal and breath sounds normal.  GI: Soft. Bowel sounds are normal.  Gravid--fundal height appears LGA, Soft, NT  Musculoskeletal: Normal range of  motion. She exhibits edema.  Neurological: She is alert and oriented to person, place, and time.  Skin: Skin is warm and dry.  Or[place] de'Peu noted on lower abdomen above pelvis.  Tender to touch.   Psychiatric: She has a normal mood and affect. Her behavior is normal.    [place]: EFW: 9 1/2 lbs Presentation: Vertex  FHR: 135 bpm, Mod Var, -Decels, +Accels UCs:  Q5-52min, palpates mild  Prenatal labs: ABO, Rh: --/--/O POS (08/12 1426) Antibody: NEG (04/11 1146) Rubella:  Immune RPR: Nonreactive (10/18 0000)  HBsAg: Negative (10/18 0000)  HIV: Non Reactive (08/12 1426)  GBS:  Positive Sickle cell/Hgb electrophoresis:  Normal Pap:  Deferred to PPP GC:  Negative Chlamydia:  Negative Other:  PIH Labs    Assessment IUP at 37.6wks Cat I FT PIH with HA and Pitting Edema IOL  LGA Fetus Bishop Score 6-7 GBS Positive  Plan: Direct Admit to [facility] per consult with Dr. ND Routine Labor and Delivery Orders per [facility] Protocol Routine Induction/Augmentation Orders In room to complete assessment and discuss POC: -Discussed r/b of induction including fetal distress, serial induction, pain, and increased risk of c/s delivery -Discussed induction methods including cervical ripening agents, foley bulbs, and pitocin -Patient verbalizes understanding and wishes to proceed with induction process Will start pitocin  Okay for IV pain medication as desired Will given GBS prophylaxis  Consider AROM after 2nd dose of PCN Discussed LGA infant and deferment of forceps and/or vacuum extraction with recommendation for C/S if strong variation from labor curve. No q/c  Joellyn Quails, MSN 05/31/2015, 11:51 AM

## 2015-06-01 ENCOUNTER — Encounter (HOSPITAL_COMMUNITY)
Admission: AD | Disposition: A | Discharge status: Home / Self Care | Payer: Self-pay | Source: Ambulatory Visit | Attending: Obstetrics and Gynecology

## 2015-06-01 ENCOUNTER — Encounter (HOSPITAL_COMMUNITY): Payer: Self-pay | Admitting: *Deleted

## 2015-06-01 LAB — CBC
HEMATOCRIT: 32.6 % — AB (ref 36.0–46.0)
HEMOGLOBIN: 10.9 g/dL — AB (ref 12.0–15.0)
MCH: 26.6 pg (ref 26.0–34.0)
MCHC: 33.4 g/dL (ref 30.0–36.0)
MCV: 79.5 fL (ref 78.0–100.0)
PLATELETS: 194 10*3/uL (ref 150–400)
RBC: 4.1 MIL/uL (ref 3.87–5.11)
RDW: 13.5 % (ref 11.5–15.5)
WBC: 17.7 10*3/uL — ABNORMAL HIGH (ref 4.0–10.5)

## 2015-06-01 LAB — RPR: RPR Ser Ql: NONREACTIVE

## 2015-06-01 LAB — MAGNESIUM: MAGNESIUM: 3.2 mg/dL — AB (ref 1.7–2.4)

## 2015-06-01 SURGERY — Surgical Case
Anesthesia: Epidural | Site: Abdomen

## 2015-06-01 MED ORDER — CEFAZOLIN SODIUM 10 G IJ SOLR
INTRAMUSCULAR | Status: AC
  Filled 2015-06-01: qty 3000

## 2015-06-01 MED ORDER — OXYCODONE HCL 5 MG PO TABS
10.0000 mg | ORAL_TABLET | ORAL | Status: DC | PRN
  Administered 2015-06-03 – 2015-06-04 (×7): 10 mg via ORAL
  Filled 2015-06-01 (×7): qty 2

## 2015-06-01 MED ORDER — DIBUCAINE 1 % RE OINT: 1.0000 "application " | TOPICAL_OINTMENT | RECTAL | Status: DC | PRN

## 2015-06-01 MED ORDER — LANOLIN HYDROUS EX OINT: 1.0000 "application " | TOPICAL_OINTMENT | CUTANEOUS | Status: DC | PRN

## 2015-06-01 MED ORDER — ACETAMINOPHEN 325 MG PO TABS
650.0000 mg | ORAL_TABLET | ORAL | Status: DC | PRN
  Administered 2015-06-03: 650 mg via ORAL
  Filled 2015-06-01: qty 2

## 2015-06-01 MED ORDER — DEXAMETHASONE SODIUM PHOSPHATE 4 MG/ML IJ SOLN
INTRAMUSCULAR | Status: AC
  Filled 2015-06-01: qty 1

## 2015-06-01 MED ORDER — LACTATED RINGERS IV SOLN
2.0000 g/h | INTRAVENOUS | Status: AC
  Administered 2015-06-01: 2 g/h via INTRAVENOUS
  Filled 2015-06-01 (×2): qty 80

## 2015-06-01 MED ORDER — MORPHINE SULFATE (PF) 0.5 MG/ML IJ SOLN
INTRAMUSCULAR | Status: AC
  Filled 2015-06-01: qty 10

## 2015-06-01 MED ORDER — FERROUS SULFATE 325 (65 FE) MG PO TABS
325.0000 mg | ORAL_TABLET | Freq: Two times a day (BID) | ORAL | Status: DC
  Administered 2015-06-01 – 2015-06-04 (×8): 325 mg via ORAL
  Filled 2015-06-01 (×8): qty 1

## 2015-06-01 MED ORDER — MEASLES, MUMPS & RUBELLA VAC ~~LOC~~ INJ
0.5000 mL | INJECTION | Freq: Once | SUBCUTANEOUS | Status: DC
  Filled 2015-06-01: qty 0.5

## 2015-06-01 MED ORDER — MORPHINE SULFATE (PF) 0.5 MG/ML IJ SOLN
INTRAMUSCULAR | Status: DC | PRN
  Administered 2015-06-01: 4000 ug via EPIDURAL

## 2015-06-01 MED ORDER — FLEET ENEMA 7-19 GM/118ML RE ENEM: 1.0000 | ENEMA | Freq: Every day | RECTAL | Status: DC | PRN

## 2015-06-01 MED ORDER — ONDANSETRON HCL 4 MG/2ML IJ SOLN
INTRAMUSCULAR | Status: AC
  Filled 2015-06-01: qty 2

## 2015-06-01 MED ORDER — MEPERIDINE HCL 25 MG/ML IJ SOLN
INTRAMUSCULAR | Status: AC
  Filled 2015-06-01: qty 1

## 2015-06-01 MED ORDER — SCOPOLAMINE 1 MG/3DAYS TD PT72
MEDICATED_PATCH | TRANSDERMAL | Status: AC
  Filled 2015-06-01: qty 1

## 2015-06-01 MED ORDER — WITCH HAZEL-GLYCERIN EX PADS: 1.0000 "application " | MEDICATED_PAD | CUTANEOUS | Status: DC | PRN

## 2015-06-01 MED ORDER — ONDANSETRON HCL 4 MG/2ML IJ SOLN
INTRAMUSCULAR | Status: DC | PRN
  Administered 2015-06-01: 4 mg via INTRAVENOUS

## 2015-06-01 MED ORDER — SENNOSIDES-DOCUSATE SODIUM 8.6-50 MG PO TABS
2.0000 | ORAL_TABLET | ORAL | Status: DC
  Administered 2015-06-01 – 2015-06-03 (×3): 2 via ORAL
  Filled 2015-06-01 (×3): qty 2

## 2015-06-01 MED ORDER — SCOPOLAMINE 1 MG/3DAYS TD PT72
MEDICATED_PATCH | TRANSDERMAL | Status: DC | PRN
  Administered 2015-06-01: 1 via TRANSDERMAL

## 2015-06-01 MED ORDER — OXYTOCIN 10 UNIT/ML IJ SOLN: 2.5000 [IU]/h | INTRAMUSCULAR | Status: AC

## 2015-06-01 MED ORDER — BISACODYL 10 MG RE SUPP
10.0000 mg | Freq: Every day | RECTAL | Status: DC | PRN
  Administered 2015-06-03: 10 mg via RECTAL
  Filled 2015-06-01 (×2): qty 1

## 2015-06-01 MED ORDER — IBUPROFEN 600 MG PO TABS
600.0000 mg | ORAL_TABLET | Freq: Four times a day (QID) | ORAL | Status: DC
  Administered 2015-06-01 – 2015-06-04 (×14): 600 mg via ORAL
  Filled 2015-06-01 (×14): qty 1

## 2015-06-01 MED ORDER — MAGNESIUM SULFATE BOLUS VIA INFUSION
4.0000 g | Freq: Once | INTRAVENOUS | Status: AC
  Administered 2015-06-01: 4 g via INTRAVENOUS
  Filled 2015-06-01: qty 500

## 2015-06-01 MED ORDER — DIPHENHYDRAMINE HCL 25 MG PO CAPS
25.0000 mg | ORAL_CAPSULE | Freq: Four times a day (QID) | ORAL | Status: DC | PRN
  Administered 2015-06-01: 25 mg via ORAL
  Filled 2015-06-01: qty 1

## 2015-06-01 MED ORDER — SIMETHICONE 80 MG PO CHEW
80.0000 mg | CHEWABLE_TABLET | Freq: Three times a day (TID) | ORAL | Status: DC
Start: 2015-06-01 — End: 2015-06-04
  Administered 2015-06-01 – 2015-06-04 (×11): 80 mg via ORAL
  Filled 2015-06-01 (×11): qty 1

## 2015-06-01 MED ORDER — CEFAZOLIN SODIUM 10 G IJ SOLR
3.0000 g | INTRAMUSCULAR | Status: DC | PRN
  Administered 2015-06-01: 3 g via INTRAVENOUS

## 2015-06-01 MED ORDER — OXYTOCIN 10 UNIT/ML IJ SOLN
INTRAMUSCULAR | Status: AC
  Filled 2015-06-01: qty 4

## 2015-06-01 MED ORDER — ZOLPIDEM TARTRATE 5 MG PO TABS: 5.0000 mg | ORAL_TABLET | Freq: Every evening | ORAL | Status: DC | PRN

## 2015-06-01 MED ORDER — LACTATED RINGERS IV SOLN: INTRAVENOUS | Status: DC | PRN

## 2015-06-01 MED ORDER — SIMETHICONE 80 MG PO CHEW
80.0000 mg | CHEWABLE_TABLET | ORAL | Status: DC | PRN
  Administered 2015-06-03: 80 mg via ORAL

## 2015-06-01 MED ORDER — TETANUS-DIPHTH-ACELL PERTUSSIS 5-2.5-18.5 LF-MCG/0.5 IM SUSP
0.5000 mL | Freq: Once | INTRAMUSCULAR | Status: DC
  Filled 2015-06-01: qty 0.5

## 2015-06-01 MED ORDER — PRENATAL MULTIVITAMIN CH
1.0000 | ORAL_TABLET | Freq: Every day | ORAL | Status: DC
  Administered 2015-06-01 – 2015-06-04 (×4): 1 via ORAL
  Filled 2015-06-01 (×4): qty 1

## 2015-06-01 MED ORDER — SODIUM BICARBONATE 8.4 % IV SOLN
INTRAVENOUS | Status: DC | PRN
  Administered 2015-06-01 (×2): 5 mL via EPIDURAL

## 2015-06-01 MED ORDER — SODIUM CHLORIDE 0.9 % IR SOLN
Status: DC | PRN
  Administered 2015-06-01: 1000 mL

## 2015-06-01 MED ORDER — OXYCODONE HCL 5 MG PO TABS
5.0000 mg | ORAL_TABLET | ORAL | Status: DC | PRN
  Administered 2015-06-01 – 2015-06-03 (×6): 5 mg via ORAL
  Filled 2015-06-01 (×6): qty 1

## 2015-06-01 MED ORDER — LACTATED RINGERS IV SOLN
INTRAVENOUS | Status: DC | PRN
  Administered 2015-06-01: 03:00:00 via INTRAVENOUS

## 2015-06-01 MED ORDER — LACTATED RINGERS IV SOLN
40.0000 [IU] | INTRAVENOUS | Status: DC | PRN
  Administered 2015-06-01: 40 [IU] via INTRAVENOUS

## 2015-06-01 MED ORDER — MEPERIDINE HCL 25 MG/ML IJ SOLN
INTRAMUSCULAR | Status: DC | PRN
  Administered 2015-06-01 (×2): 12.5 mg via INTRAVENOUS

## 2015-06-01 MED ORDER — LACTATED RINGERS IV SOLN
INTRAVENOUS | Status: DC
  Administered 2015-06-01: 100 mL/h via INTRAVENOUS

## 2015-06-01 MED ORDER — SIMETHICONE 80 MG PO CHEW
80.0000 mg | CHEWABLE_TABLET | ORAL | Status: DC
  Administered 2015-06-01 – 2015-06-02 (×2): 80 mg via ORAL
  Filled 2015-06-01 (×3): qty 1

## 2015-06-01 MED ORDER — DEXAMETHASONE SODIUM PHOSPHATE 4 MG/ML IJ SOLN
INTRAMUSCULAR | Status: DC | PRN
  Administered 2015-06-01: 4 mg via INTRAVENOUS

## 2015-06-01 MED ORDER — MENTHOL 3 MG MT LOZG
1.0000 | LOZENGE | OROMUCOSAL | Status: DC | PRN
  Filled 2015-06-01: qty 9

## 2015-06-01 MED ORDER — LIDOCAINE-EPINEPHRINE (PF) 2 %-1:200000 IJ SOLN
INTRAMUSCULAR | Status: AC
  Filled 2015-06-01: qty 20

## 2015-06-01 SURGICAL SUPPLY — 49 items
APL SKNCLS STERI-STRIP NONHPOA (GAUZE/BANDAGES/DRESSINGS) ×1
BENZOIN TINCTURE PRP APPL 2/3 (GAUZE/BANDAGES/DRESSINGS) ×3 IMPLANT
CHLORAPREP W/TINT 26ML (MISCELLANEOUS) ×3 IMPLANT
CLAMP CORD UMBIL (MISCELLANEOUS) IMPLANT
CLOSURE WOUND 1/2 X4 (GAUZE/BANDAGES/DRESSINGS) ×1
CLOTH BEACON ORANGE TIMEOUT ST (SAFETY) ×3 IMPLANT
CONTAINER PREFILL 10% NBF 15ML (MISCELLANEOUS) IMPLANT
DRAIN JACKSON PRT FLT 10 (DRAIN) ×2 IMPLANT
DRSG OPSITE POSTOP 4X10 (GAUZE/BANDAGES/DRESSINGS) ×3 IMPLANT
ELECT REM PT RETURN 9FT ADLT (ELECTROSURGICAL) ×3
ELECTRODE REM PT RTRN 9FT ADLT (ELECTROSURGICAL) ×1 IMPLANT
EVACUATOR SILICONE 100CC (DRAIN) ×2 IMPLANT
EXTENDER TRAXI PANNICULUS (MISCELLANEOUS) IMPLANT
EXTRACTOR VACUUM M CUP 4 TUBE (SUCTIONS) IMPLANT
EXTRACTOR VACUUM M CUP 4' TUBE (SUCTIONS)
GLOVE BIO SURGEON STRL SZ 6.5 (GLOVE) ×2 IMPLANT
GLOVE BIO SURGEON STRL SZ7 (GLOVE) ×2 IMPLANT
GLOVE BIO SURGEONS STRL SZ 6.5 (GLOVE) ×1
GLOVE BIOGEL PI IND STRL 7.0 (GLOVE) ×3 IMPLANT
GLOVE BIOGEL PI INDICATOR 7.0 (GLOVE) ×12
GOWN STRL REUS W/ TWL XL LVL3 (GOWN DISPOSABLE) IMPLANT
GOWN STRL REUS W/TWL LRG LVL3 (GOWN DISPOSABLE) ×6 IMPLANT
GOWN STRL REUS W/TWL XL LVL3 (GOWN DISPOSABLE) ×3
HEMOSTAT SURGICEL 2X14 (HEMOSTASIS) ×2 IMPLANT
KIT ABG SYR 3ML LUER SLIP (SYRINGE) IMPLANT
NDL HYPO 25X5/8 SAFETYGLIDE (NEEDLE) IMPLANT
NEEDLE HYPO 25X5/8 SAFETYGLIDE (NEEDLE) IMPLANT
NS IRRIG 1000ML POUR BTL (IV SOLUTION) ×3 IMPLANT
PACK C SECTION WH (CUSTOM PROCEDURE TRAY) ×3 IMPLANT
PAD ABD 7.5X8 STRL (GAUZE/BANDAGES/DRESSINGS) ×2 IMPLANT
PAD OB MATERNITY 4.3X12.25 (PERSONAL CARE ITEMS) ×3 IMPLANT
PENCIL SMOKE EVAC W/HOLSTER (ELECTROSURGICAL) ×3 IMPLANT
RETRACTOR TRAXI PANNICULUS (MISCELLANEOUS) IMPLANT
RTRCTR C-SECT PINK 25CM LRG (MISCELLANEOUS) IMPLANT
SPONGE DRAIN TRACH 4X4 STRL 2S (GAUZE/BANDAGES/DRESSINGS) ×2 IMPLANT
SPONGE GAUZE 4X4 12PLY STER LF (GAUZE/BANDAGES/DRESSINGS) ×4 IMPLANT
STRIP CLOSURE SKIN 1/2X4 (GAUZE/BANDAGES/DRESSINGS) ×2 IMPLANT
SUT CHROMIC 0 CT 1 (SUTURE) ×3 IMPLANT
SUT MNCRL AB 3-0 PS2 27 (SUTURE) ×3 IMPLANT
SUT PLAIN 2 0 (SUTURE) ×6
SUT PLAIN 2 0 XLH (SUTURE) ×3 IMPLANT
SUT PLAIN ABS 2-0 CT1 27XMFL (SUTURE) ×2 IMPLANT
SUT SILK 2 0 SH (SUTURE) ×2 IMPLANT
SUT VIC AB 0 CTX 36 (SUTURE) ×12
SUT VIC AB 0 CTX36XBRD ANBCTRL (SUTURE) ×4 IMPLANT
TOWEL OR 17X24 6PK STRL BLUE (TOWEL DISPOSABLE) ×3 IMPLANT
TRAXI PANNICULUS EXTENDER (MISCELLANEOUS) ×2
TRAXI PANNICULUS RETRACTOR (MISCELLANEOUS) ×2
TRAY FOLEY CATH SILVER 14FR (SET/KITS/TRAYS/PACK) ×1 IMPLANT

## 2015-06-01 NOTE — Lactation Note (Signed)
This note was copied from a baby's chart. Lactation Consultation Note  Initial visit made.  Breastfeeding consultation services and support given and reviewed.  Baby is 8 hours old.  Mom states baby has latched but mainly sleepy.  Reviewed breastfeeding basics.  Instructed to feed with any feeding cue and to call for concerns/assist prn.  Patient Name: Regina Kirke CorinDeonna Stamey IONGE'XToday's Date: 06/01/2015 Reason for consult: Initial assessment   Maternal Data    Feeding Feeding Type: Breast Fed Length of feed: 20 min (on and off per mom)  LATCH Score/Interventions                      Lactation Tools Discussed/Used     Consult Status Consult Status: Follow-up Date: 06/02/15 Follow-up type: In-patient    Huston FoleyMOULDEN, [name] 06/01/2015, 11:12 AM

## 2015-06-01 NOTE — Anesthesia Postprocedure Evaluation (Signed)
Anesthesia Post Note  Patient: Regina PersonsDeonna Rochelle Nelson  Procedure(s) Performed: Procedure(s) (LRB): CESAREAN SECTION (N/A)  Patient location during evaluation: Antenatal Anesthesia Type: Epidural Level of consciousness: awake and alert, oriented and patient cooperative Pain management: pain level controlled Vital Signs Assessment: post-procedure vital signs reviewed and stable Respiratory status: spontaneous breathing and nonlabored ventilation Cardiovascular status: stable Postop Assessment: no headache, no backache, patient able to bend at knees, no signs of nausea or vomiting and adequate PO intake Anesthetic complications: no Comments: Pain level 5, comfortable with this level.     Last Vitals:  Filed Vitals:   06/01/15 0932 06/01/15 1045  BP: 137/77 125/68  Pulse: 102 101  Temp:    Resp: 18 18    Last Pain:  Filed Vitals:   06/01/15 1050  PainSc: 5                  Steed Kanaan

## 2015-06-01 NOTE — Transfer of Care (Signed)
Immediate Anesthesia Transfer of Care Note  Patient: Regina PersonsDeonna Rochelle Nelson  Procedure(s) Performed: Procedure(s): CESAREAN SECTION (N/A)  Patient Location: PACU  Anesthesia Type:Epidural  Level of Consciousness: awake, alert  and oriented  Airway & Oxygen Therapy: Patient Spontanous Breathing  Post-op Assessment: Report given to RN and Post -op Vital signs reviewed and stable  Post vital signs: Reviewed and stable  Last Vitals:  Filed Vitals:   06/01/15 0206 06/01/15 0349  BP: 134/92 110/94  Pulse: 101 89  Temp:    Resp:  25    Complications: No apparent anesthesia complications

## 2015-06-01 NOTE — Lactation Note (Signed)
This note was copied from a baby's chart. Lactation Consultation Note  Patient Name: Regina Nelson ZOXWR'UToday's Date: 06/01/2015 Reason for consult: Follow-up assessment Baby at 15 hr of life and mom is worried about latch. Baby has been very sleepy at the last couple of bf attempts. Mom was using the DEBP upon entry. Suggested that she use the 27mm flange next time. Baby was sleeping with a family member. Demonstrated how to wake baby and offer the expressed milk with an oral syring. Baby was gagging some, has a high palate, at first was gumming the finger, but after a minute he was able to produce a rhythmic suck. Baby became more awake so he was placed at the breast. He attempted to latch a few times but would come off and close his eyes after 1-2 sucks. Encouraged mom to keep trying. She seems frustrated by pumping and latching. Discussed baby behavior, feeding frequency, baby belly size, voids, wt loss, breast changes, and nipple care. She will continue to offer the breast 8+/24hr, pump if baby is not latching, and offer her milk with an oral syring or cup.     Maternal Data Has patient been taught Hand Expression?: Yes Does the patient have breastfeeding experience prior to this delivery?: No  Feeding Feeding Type: Breast Fed Length of feed: 0 min  LATCH Score/Interventions                      Lactation Tools Discussed/Used Pump Review: Setup, frequency, and cleaning;Milk Storage   Consult Status Consult Status: Follow-up Date: 06/02/15 Follow-up type: In-patient    [name] Test 06/01/2015, 5:55 PM

## 2015-06-01 NOTE — Progress Notes (Signed)
Subjective: Postpartum Day 1: Cesarean Delivery due to FTD Patient up ad lib, reports no syncope or dizziness. Feeding:  breast Contraceptive plan:  nexplanon  Objective: Vital signs in last 24 hours: Temp:  [97.6 F (36.4 C)-99.4 F (37.4 C)] 98.5 F (36.9 C) (04/12 0823) Pulse Rate:  [82-103] 97 (04/12 0824) Resp:  [9-26] 18 (04/12 0824) BP: (110-152)/(64-102) 138/75 mmHg (04/12 0824) SpO2:  [96 %-100 %] 97 % (04/12 0503) Weight:  [274 lb (124.286 kg)] 274 lb (124.286 kg) (04/11 1117)  Physical Exam:  General: alert and cooperative Lochia: appropriate Uterine Fundus: firm Abdomen:  + bowel sounds, non distended Incision: no significant drainage  Pressure dressing CDI DVT Evaluation: No evidence of DVT seen on physical exam. Homan's sign: Negative   Recent Labs  05/31/15 1144 06/01/15 0435  HGB 10.8* 10.9*  HCT 32.2* 32.6*  WBC 10.1 17.7*   Orthostatics stable.  Assessment: Status post Cesarean section day 1. Doing well postoperatively.  Honeycomb dressing in place, no significant drainage Anemia - hemodynamicly stable. Circumcision: out patient Pre eclampsia PP Mag  Plan: Continue current care. Plan for discharge tomorrow, Breastfeeding and Lactation consult    Abbegayle Denault, CNM, MSN 06/01/2015. 9:25 AM

## 2015-06-01 NOTE — Op Note (Signed)
Cesarean Section Procedure Note   Regina PersonsDeonna Rochelle Duerr  05/31/2015 - 06/01/2015  Indications: 1. Failure to descend  2. Preeclampsia 3. Maternal  Exhaustion 4. LGA Pre-operative Diagnosis: failure to descend.   Post-operative Diagnosis: Same   Surgeon: Surgeon(s) and Role:    * Jaymes GraffNaima Tasheika Kitzmiller, MD - Primary   Assistants: Claudie Revering. Sthall CNM   Anesthesia: epidural   Procedure Details:  The patient was seen in the Holding Room. The risks, benefits, complications, treatment options, and expected outcomes were discussed with the patient. The patient concurred with the proposed plan, giving informed consent. identified as Regina Personseonna Rochelle Sprinkle and the procedure verified as C-Section Delivery. A Time Out was held and the above information confirmed.  After induction of anesthesia, the patient was draped and prepped in the usual sterile manner. A transverse incision was made and carried down through the subcutaneous tissue to the fascia. Fascial incision was made in the midline and extended transversely. The fascia was separated from the underlying rectus muscle superiorly and inferiorly. The peritoneum was identified and entered. Peritoneal incision was extended longitudinally with good visualization of bowel and bladder. The utero-vesical peritoneal reflection was incised transversely and the bladder flap was bluntly freed from the lower uterine segment.  An alexsis retractor was placed in the abdomen.   A low transverse uterine incision was made. Delivered from cephalic presentation was a  infant, with Apgar scores of 8 at one minute and 10 at five minutes. Cord ph was not sent the umbilical cord was clamped and cut cord blood was obtained for evaluation. The placenta was removed Intact and appeared normal. The uterine outline, tubes and ovaries appeared normal}. The uterine incision was closed with running locked sutures of 0Vicryl. A second layer 0 vicrlyl was used to imbricate the uterine incision     Hemostasis was observed. Lavage was carried out until clear. The alexsis was removed.  The peritoneum was closed with 0 chromic.  The muscles were examined and any bleeders were made hemostatic using bovie cautery device.   The fascia was then reapproximated with running sutures of 0 vicryl.  The subcutaneous tissue was reapproximated  With interrupted stitches using 2-0 plain gut. The subcuticular closure was performed using 3-460monocryl     Instrument, sponge, and needle counts were correct prior the abdominal closure and were correct at the conclusion of the case.    Findings: infant was delivered from vtx  presentation. The fluid was clear.  The uterus tubes and ovaries appeared normal.     Estimated Blood Loss: see anesthesia note   Total IV Fluids: see anesthesia note ml   Urine Output: per anesthesia  Specimens: placenta to path  Complications: no complications  Disposition: PACU - hemodynamically stable.   Maternal Condition: stable   Baby condition / location:  Couplet care / Skin to Skin  Attending Attestation: I performed the procedure.   Signed: Surgeon(s): Jaymes GraffNaima Paisly Fingerhut, MD

## 2015-06-01 NOTE — Progress Notes (Signed)
Pt complete and pushing for 2hours ann forty minutes.  Pt desires to stop pushing.   BP 136/75 mmHg  Pulse 100  Temp(Src) 99.4 F (37.4 C) (Axillary)  Resp 20  Ht 5\' 6"  (1.676 m)  Wt 274 lb (124.286 kg)  BMI 44.25 kg/m2  SpO2 99%  LMP  (LMP Unknown) Cat 1 tracing FTD Preeclampsia Maternal exhaustion Pt desires CS will proceed.  R&B reviewed with the pt and her family

## 2015-06-02 ENCOUNTER — Encounter (HOSPITAL_COMMUNITY): Payer: Self-pay | Admitting: Obstetrics and Gynecology

## 2015-06-02 DIAGNOSIS — Z98891 History of uterine scar from previous surgery: Secondary | ICD-10-CM

## 2015-06-02 DIAGNOSIS — O1493 Unspecified pre-eclampsia, third trimester: Secondary | ICD-10-CM

## 2015-06-02 LAB — COMPREHENSIVE METABOLIC PANEL
ALBUMIN: 2.2 g/dL — AB (ref 3.5–5.0)
ALT: 13 U/L — ABNORMAL LOW (ref 14–54)
AST: 22 U/L (ref 15–41)
Alkaline Phosphatase: 94 U/L (ref 38–126)
Anion gap: 7 (ref 5–15)
BUN: 7 mg/dL (ref 6–20)
CHLORIDE: 108 mmol/L (ref 101–111)
CO2: 22 mmol/L (ref 22–32)
Calcium: 7.6 mg/dL — ABNORMAL LOW (ref 8.9–10.3)
Creatinine, Ser: 0.61 mg/dL (ref 0.44–1.00)
GFR calc Af Amer: >60 mL/min (ref >60)
Glucose, Bld: 148 mg/dL — ABNORMAL HIGH (ref 65–99)
POTASSIUM: 3.2 mmol/L — AB (ref 3.5–5.1)
SODIUM: 137 mmol/L (ref 135–145)
Total Bilirubin: 0.2 mg/dL — ABNORMAL LOW (ref 0.3–1.2)
Total Protein: 5.1 g/dL — ABNORMAL LOW (ref 6.5–8.1)

## 2015-06-02 LAB — LACTATE DEHYDROGENASE: LDH: 183 U/L (ref 98–192)

## 2015-06-02 LAB — URIC ACID: URIC ACID, SERUM: 5.7 mg/dL (ref 2.3–6.6)

## 2015-06-02 NOTE — Progress Notes (Signed)
Subjective: Postpartum Day 2: Cesarean Delivery due to FTD Patient up ad lib, reports no syncope or dizziness. Feeding:  breast Contraceptive plan:  nexplanon  Objective: Vital signs in last 24 hours: Temp:  [98.1 F (36.7 C)-98.3 F (36.8 C)] 98.1 F (36.7 C) (04/13 1002) Pulse Rate:  [86-104] 86 (04/13 1002) Resp:  [16-18] 18 (04/13 1002) BP: (121-135)/(63-79) 131/79 mmHg (04/13 1002) SpO2:  [99 %] 99 % (04/13 1001)  Physical Exam:  General: alert and cooperative Lochia: appropriate Uterine Fundus: firm Abdomen:  + bowel sounds, non distended Incision: healing well  Honeycomb dressing CDI DVT Evaluation: No evidence of DVT seen on physical exam. Homan's sign: Negative   Recent Labs  05/31/15 1144 06/01/15 0435  HGB 10.8* 10.9*  HCT 32.2* 32.6*  WBC 10.1 17.7*   Orthostatics stable.  Assessment: Status post Cesarean section day 2. Doing well postoperatively.  Pressure dressing in place, no significant drainage SP Mag x 24 hrs Anemia - hemodynamicly stable.   Circumcision: out patient   Plan: Continue current care. Plan for discharge tomorrow, Breastfeeding and Lactation consult Remove outer dressing in the morning    Lynessa Almanzar, CNM, MSN 06/02/2015. 11:45 AM

## 2015-06-02 NOTE — Lactation Note (Signed)
This note was copied from a baby's chart. Lactation Consultation Note  Patient Name: Regina Nelson CorinDeonna Burkman WUJWJ'XToday's Date: 06/02/2015 Reason for consult: Follow-up assessment;Difficult latch  Baby 37 hours old. Mom reports that she is about to get into the shower and then intends to start pumping afterwards. Mom states that she is giving formula/bottles because she is not really getting anything when she pumps. Mom reports that she really hasn't been pumping a lot because she hasn't felt like pumping because of the Magnesium Sulphate that she was getting. Mom reports that she does want to have the baby nurse at the breast, and she does want to pump and give EBM in bottles. Discussed supply and demand with mom, and the normal progression of milk coming to volume. Enc mom to pump at least 8 times/24 hours in order to have a good breast milk supply for the baby. Enc mom to call for assistance with pumping/latching as needed. Mom reports that she thinks she will be d/c'd on Saturday, and then she has a follow-up appointment with Darlin PriestlyBarb Carder and [facility] on Monday for BF assistance. Discussed with mom that the baby needs to be put to breast if she wants the baby to successfully nurse. Enc mom to put baby to breast first, then supplement with EBM/formula, and the post-pump followed by hand expression.    Maternal Data    Feeding    LATCH Score/Interventions                      Lactation Tools Discussed/Used     Consult Status Consult Status: Follow-up Date: 06/03/15 Follow-up type: In-patient    Geralynn OchsWILLIARD, Clarity Ciszek 06/02/2015, 4:13 PM

## 2015-06-03 LAB — URIC ACID: Uric Acid, Serum: 5.5 mg/dL (ref 2.3–6.6)

## 2015-06-03 LAB — COMPREHENSIVE METABOLIC PANEL
ALBUMIN: 2.1 g/dL — AB (ref 3.5–5.0)
ALT: 20 U/L (ref 14–54)
ANION GAP: 6 (ref 5–15)
AST: 25 U/L (ref 15–41)
Alkaline Phosphatase: 90 U/L (ref 38–126)
BUN: 10 mg/dL (ref 6–20)
CHLORIDE: 108 mmol/L (ref 101–111)
CO2: 23 mmol/L (ref 22–32)
Calcium: 8.1 mg/dL — ABNORMAL LOW (ref 8.9–10.3)
Creatinine, Ser: 0.53 mg/dL (ref 0.44–1.00)
GFR calc non Af Amer: >60 mL/min (ref >60)
GLUCOSE: 112 mg/dL — AB (ref 65–99)
Potassium: 3.5 mmol/L (ref 3.5–5.1)
SODIUM: 137 mmol/L (ref 135–145)
Total Bilirubin: 0.4 mg/dL (ref 0.3–1.2)
Total Protein: 5.3 g/dL — ABNORMAL LOW (ref 6.5–8.1)

## 2015-06-03 LAB — LACTATE DEHYDROGENASE: LDH: 177 U/L (ref 98–192)

## 2015-06-03 MED ORDER — FUROSEMIDE 20 MG PO TABS
20.0000 mg | ORAL_TABLET | Freq: Two times a day (BID) | ORAL | Status: DC
  Administered 2015-06-03 – 2015-06-04 (×3): 20 mg via ORAL
  Filled 2015-06-03 (×4): qty 1

## 2015-06-03 NOTE — Progress Notes (Signed)
Regina Nelson [id number]  Subjective: Postpartum Day 2: Primary LTC/S due to FTD C/o increased generalized edema making it painful to walk Patient up ad lib, reports no syncope or dizziness.   Feeding:  Breast pumping/bottle Contraceptive plan:  nexplanon Pain  Moderately controlled by ibuprofen and Oxycodone   Objective: Temp:  [97.2 F (36.2 C)-98.8 F (37.1 C)] 97.2 F (36.2 C) (04/14 1339) Pulse Rate:  [69-95] 87 (04/14 1339) Resp:  [18-20] 18 (04/14 1339) BP: (119-140)/(66-94) 119/66 mmHg (04/14 1339) SpO2:  [98 %-100 %] 100 % (04/14 1339) Weight:  [125.646 kg (277 lb)] 125.646 kg (277 lb) (04/14 0534)  CBC Latest Ref Rng 06/01/2015 05/31/2015 05/26/2015  WBC 4.0 - 10.5 K/uL 17.7(H) 10.1 9.8  Hemoglobin 12.0 - 15.0 g/dL 10.9(L) 10.8(L) 11.3(L)  Hematocrit 36.0 - 46.0 % 32.6(L) 32.2(L) 33.2(L)  Platelets 150 - 400 K/uL 194 189 198   . ferrous sulfate  325 mg Oral BID WC  . furosemide  20 mg Oral BID  . ibuprofen  600 mg Oral 4 times per day  . measles, mumps and rubella vaccine  0.5 mL Subcutaneous Once  . prenatal multivitamin  1 tablet Oral Q1200  . senna-docusate  2 tablet Oral Q24H  . simethicone  80 mg Oral TID PC  . simethicone  80 mg Oral Q24H  . Tdap  0.5 mL Intramuscular Once    Physical Exam:  General: alert and cooperative Regina Nelson: appropriate Uterine Fundus: firm Abdomen:  + bowel sounds, NT, edematous pannis  Lungs CTA bilaterally  Incision: Honeycomb dressing CDI DVT Evaluation: No evidence of DVT seen on physical exam. Significant generalized edema.  Non pitting edema most significant in bilateral extremities.  JP drain: 10 cc  Assessment/Plan: Status post cesarean delivery, day 2 Significant Generalized nonpitting edema Begin Lasix 20mg  PO BID  Encouraged to eat a banana daily to replenish potassium Encourage to request pain medicine to achieve adequate pain relief Stable Continue current care. Breastfeeding  Dr. Dion Nelson consulted and  aware   Alphonzo SeveranceRachel Mckoy Bhakta MSN, CNM 06/03/2015, 3:08 PM

## 2015-06-03 NOTE — Lactation Note (Signed)
This note was copied from a baby's chart. Lactation Consultation Note  Follow up visit made.  Mom is feeding baby with formula bottles.  She states he latches and gets fussy because milk isn't there.  Pumps some but doesn't feel the need because she doesn't obtain milk.  Colostrum and milk coming to volume reviewed.  Stressed importance of putting baby to breast and post pumping for establishing milk supply.  Offered latch assist and mom will call if she desires assist.  Patient Name: Regina Nelson NWGNF'AToday's Date: 06/03/2015     Maternal Data    Feeding Feeding Type: Breast Fed Nipple Type: Regular  LATCH Score/Interventions Latch: Repeated attempts needed to sustain latch, nipple held in mouth throughout feeding, stimulation needed to elicit sucking reflex. Intervention(s): Adjust position;Assist with latch  Audible Swallowing: None Intervention(s): Skin to skin;Hand expression Intervention(s): Skin to skin;Hand expression  Type of Nipple: Everted at rest and after stimulation  Comfort (Breast/Nipple): Soft / non-tender     Hold (Positioning): Full assist, staff holds infant at breast Intervention(s): Support Pillows  LATCH Score: 5  Lactation Tools Discussed/Used     Consult Status      Huston Nelson, Regina Navejas S 06/03/2015, 10:40 AM

## 2015-06-04 LAB — COMPREHENSIVE METABOLIC PANEL
ALBUMIN: 2.4 g/dL — AB (ref 3.5–5.0)
ALT: 69 U/L — ABNORMAL HIGH (ref 14–54)
ANION GAP: 7 (ref 5–15)
AST: 69 U/L — AB (ref 15–41)
Alkaline Phosphatase: 88 U/L (ref 38–126)
BILIRUBIN TOTAL: 0.4 mg/dL (ref 0.3–1.2)
BUN: 12 mg/dL (ref 6–20)
CHLORIDE: 107 mmol/L (ref 101–111)
CO2: 23 mmol/L (ref 22–32)
Calcium: 8.4 mg/dL — ABNORMAL LOW (ref 8.9–10.3)
Creatinine, Ser: 0.55 mg/dL (ref 0.44–1.00)
GFR calc Af Amer: >60 mL/min (ref >60)
GFR calc non Af Amer: >60 mL/min (ref >60)
GLUCOSE: 88 mg/dL (ref 65–99)
Potassium: 3.8 mmol/L (ref 3.5–5.1)
SODIUM: 137 mmol/L (ref 135–145)
TOTAL PROTEIN: 5.8 g/dL — AB (ref 6.5–8.1)

## 2015-06-04 LAB — URIC ACID: Uric Acid, Serum: 5 mg/dL (ref 2.3–6.6)

## 2015-06-04 LAB — LACTATE DEHYDROGENASE: LDH: 196 U/L — ABNORMAL HIGH (ref 98–192)

## 2015-06-04 MED ORDER — OXYCODONE HCL 5 MG PO TABS: 5.0000 mg | ORAL_TABLET | ORAL | Status: DC | PRN

## 2015-06-04 MED ORDER — IBUPROFEN 600 MG PO TABS: 600.0000 mg | ORAL_TABLET | Freq: Four times a day (QID) | ORAL | Status: DC | PRN

## 2015-06-04 MED ORDER — FUROSEMIDE 20 MG PO TABS: 20.0000 mg | ORAL_TABLET | Freq: Two times a day (BID) | ORAL | Status: DC

## 2015-06-04 NOTE — Lactation Note (Signed)
This note was copied from a baby's chart. Lactation Consultation Note  Patient Name: Regina Nelson Regina Nelson IONGE'XToday's Date: 06/04/2015 Reason for consult: Follow-up assessment;Hyperbilirubinemia [facility] is 7485 hours old [facility]. [facility] on double phototherapy & [facility] Mom reports that she has been trying to pump but has not been BF because she didn't feel as though she had a lot of milk and is nervous to try now with the bili-lights. Discussed importance of regular/ frequent stimulation & that her nurse could help her latch with the lights if she changed her mind. Encouraged mom to pump q 3-4 hrs for 10-15 mins & latch when she can/ decides to. Discussed milk volume. Mom reports no other questions at this time. [facility].  Maternal Data    Feeding Feeding Type: Formula Nipple Type: Slow - flow  LATCH Score/Interventions                      Lactation Tools Discussed/Used     Consult Status Consult Status: Follow-up Date: 06/05/15 Follow-up type: In-patient    Oneal GroutLaura C Jarae Panas 06/04/2015, 3:52 PM

## 2015-06-04 NOTE — Discharge Summary (Signed)
OB Discharge Summary     Patient Name: Regina Nelson DOB: 05/07/1990 MRN: [id number]  Date of admission: 05/31/2015 Delivering MD: Jaymes GraffILLARD, NAIMA   Date of discharge: 06/04/2015  Admitting diagnosis: INDUCTION Intrauterine pregnancy: 5586w0d     Secondary diagnosis:  Principal Problem:   Status post primary low transverse cesarean section--FTD Active Problems:   Pregnancy induced hypertension   Pre-eclampsia in third trimester  Additional problems: none     Discharge diagnosis: Term Pregnancy Delivered and Preeclampsia (mild)                                                                                                Post partum procedures:none  Augmentation: Pitocin  Complications: None  Hospital course:  Induction of Labor With Cesarean Section  25 y.o. yo G1P1001 at 6786w0d was admitted to the hospital 05/31/2015 for induction of labor. Patient had a labor course significant for failure to descend. The patient went for cesarean section due to Macrosomia, Arrest of Descent and Preeclampsia, maternal exhaustion, and delivered a Viable infant,@BABYSUPPRESS (DBLINK,ept,110,,1,,) Membrane Rupture Time/Date: )2:08 PM ,05/31/2015   @Details  of operation can be found in separate operative Note.  Patient had an uncomplicated postpartum course. She is ambulating, tolerating a regular diet, passing flatus, and urinating well.  Patient is discharged home in stable condition on 06/04/2015.                                     Physical exam  Filed Vitals:   06/04/15 0516 06/04/15 0546 06/04/15 0615 06/04/15 1000  BP: 126/87   124/75  Pulse: 89   88  Temp: 98.3 F (36.8 C)   98.4 F (36.9 C)  TempSrc: Oral   Oral  Resp: 20   20  Height:      Weight:  126.44 kg (278 lb 12 oz) 125.701 kg (277 lb 1.9 oz)   SpO2: 100%   99%   General: alert and cooperative Lochia: appropriate Uterine Fundus: firm Incision: Healing well with no significant drainage DVT Evaluation: No evidence  of DVT seen on physical exam.   Significant generalized edema noted  Labs: Lab Results  Component Value Date   WBC 17.7* 06/01/2015   HGB 10.9* 06/01/2015   HCT 32.6* 06/01/2015   MCV 79.5 06/01/2015   PLT 194 06/01/2015   CMP Latest Ref Rng 06/04/2015  Glucose 65 - 99 mg/dL 88  BUN 6 - 20 mg/dL 12  Creatinine 4.090.44 - 8.111.00 mg/dL 9.140.55  Sodium 782135 - 956145 mmol/L 137  Potassium 3.5 - 5.1 mmol/L 3.8  Chloride 101 - 111 mmol/L 107  CO2 22 - 32 mmol/L 23  Calcium 8.9 - 10.3 mg/dL 2.1(H8.4(L)  Total Protein 6.5 - 8.1 g/dL 0.8(M5.8(L)  Total Bilirubin 0.3 - 1.2 mg/dL 0.4  Alkaline Phos 38 - 126 U/L 88  AST 15 - 41 U/L 69(H)  ALT 14 - 54 U/L 69(H)    Discharge instruction: per After Visit Summary and "Baby and Me Booklet".   Postpartum care after cesarean  delivery, breastfeed, [employer], and Iron Rich diet patient instructions provided.   After visit meds:    Medication List    STOP taking these medications        acetaminophen 500 MG tablet  Commonly known as:  TYLENOL     calcium carbonate 500 MG chewable tablet  Commonly known as:  TUMS - dosed in mg elemental calcium     zolpidem 12.5 MG CR tablet  Commonly known as:  AMBIEN CR      TAKE these medications        furosemide 20 MG tablet  Commonly known as:  LASIX  Take 1 tablet (20 mg total) by mouth 2 (two) times daily.     ibuprofen 600 MG tablet  Commonly known as:  ADVIL,MOTRIN  Take 1 tablet (600 mg total) by mouth every 6 (six) hours as needed.     oxyCODONE 5 MG immediate release tablet  Commonly known as:  Oxy IR/ROXICODONE  Take 1 tablet (5 mg total) by mouth every 4 (four) hours as needed (pain scale 4-7).     prenatal multivitamin Tabs tablet  Take 1 tablet by mouth daily at 12 noon.        Diet: routine diet  Activity: Advance as tolerated. Pelvic rest for 6 weeks.   Outpatient follow ZO:XWRUE start RN to evaluate BP in 3-4 days, Call [facility] to schedule Circumcision in  2 days, and call [facility] to schedule   postpartum  visit at 6 weeks and Contraception visit for nexplanon insertion before 6 weeks.  Follow up Appt:No future appointments. Follow up Visit:No Follow-up on file.  Postpartum contraception: Nexplanon  Newborn Data: Live born female  Birth Weight: 8 lb 4.5 oz (3755 g) APGAR: , 10  Baby Feeding: Breast Disposition:rooming in   06/04/2015 Alphonzo Severance, CNM

## 2015-06-04 NOTE — Progress Notes (Signed)
D/c instructions and prescriptions reviewed with pt. Pt verbalized understanding. Infant will be remaining a pt at the hospital. No questions at this time.

## 2015-06-04 NOTE — Discharge Instructions (Signed)
Postpartum Care After Cesarean Delivery After you deliver your newborn (postpartum period), the usual stay in the hospital is 24-72 hours. If there were problems with your labor or delivery, or if you have other medical problems, you might be in the hospital longer.  While you are in the hospital, you will receive help and instructions on how to care for yourself and your newborn during the postpartum period.  While you are in the hospital:  It is normal for you to have pain or discomfort from the incision in your abdomen. Be sure to tell your nurses when you are having pain, where the pain is located, and what makes the pain worse.  If you are breastfeeding, you may feel uncomfortable contractions of your uterus for a couple of weeks. This is normal. The contractions help your uterus get back to normal size.  It is normal to have some bleeding after delivery.  For the first 1-3 days after delivery, the flow is red and the amount may be similar to a period.  It is common for the flow to start and stop.  In the first few days, you may pass some small clots. Let your nurses know if you begin to pass large clots or your flow increases.  Do not  flush blood clots down the toilet before having the nurse look at them.  During the next 3-10 days after delivery, your flow should become more watery and pink or brown-tinged in color.  Ten to fourteen days after delivery, your flow should be a small amount of yellowish-white discharge.  The amount of your flow will decrease over the first few weeks after delivery. Your flow may stop in 6-8 weeks. Most women have had their flow stop by 12 weeks after delivery.  You should change your sanitary pads frequently.  Wash your hands thoroughly with soap and water for at least 20 seconds after changing pads, using the toilet, or before holding or feeding your newborn.  Your intravenous (IV) tubing will be removed when you are drinking enough fluids.  The  urine drainage tube (urinary catheter) that was inserted before delivery may be removed within 6-8 hours after delivery or when feeling returns to your legs. You should feel like you need to empty your bladder within the first 6-8 hours after the catheter has been removed.  In case you become weak, lightheaded, or faint, call your nurse before you get out of bed for the first time and before you take a shower for the first time.  Within the first few days after delivery, your breasts may begin to feel tender and full. This is called engorgement. Breast tenderness usually goes away within 48-72 hours after engorgement occurs. You may also notice milk leaking from your breasts. If you are not breastfeeding, do not stimulate your breasts. Breast stimulation can make your breasts produce more milk.  Spending as much time as possible with your newborn is very important. During this time, you and your newborn can feel close and get to know each other. Having your newborn stay in your room (rooming in) will help to strengthen the bond with your newborn. It will give you time to get to know your newborn and become comfortable caring for your newborn.  Your hormones change after delivery. Sometimes the hormone changes can temporarily cause you to feel sad or tearful. These feelings should not last more than a few days. If these feelings last longer than that, you should talk to your  caregiver. °· If desired, talk to your caregiver about methods of family planning or contraception. °· Talk to your caregiver about immunizations. Your caregiver may want you to have the following immunizations before leaving the hospital: °· Tetanus, diphtheria, and pertussis (Tdap) or tetanus and diphtheria (Td) immunization. It is very important that you and your family (including grandparents) or others caring for your newborn are up-to-date with the Tdap or Td immunizations. The Tdap or Td immunization can help protect your newborn  from getting ill. °· Rubella immunization. °· Varicella (chickenpox) immunization. °· Influenza immunization. You should receive this annual immunization if you did not receive the immunization during your pregnancy. °  °This information is not intended to replace advice given to you by your health care provider. Make sure you discuss any questions you have with your health care provider. °  °Document Released: 10/31/2011 Document Reviewed: 10/31/2011 °Elsevier Interactive Patient Education ©2016 [employer] °Breastfeeding °Deciding to breastfeed is one of the best choices you can make for you and your baby. A change in hormones during pregnancy causes your breast tissue to grow and increases the number and size of your milk ducts. These hormones also allow proteins, sugars, and fats from your blood supply to make breast milk in your milk-producing glands. Hormones prevent breast milk from being released before your baby is born as well as prompt milk flow after birth. Once breastfeeding has begun, thoughts of your baby, as well as his or her sucking or crying, can stimulate the release of milk from your milk-producing glands.  °BENEFITS OF BREASTFEEDING °For Your Baby °· Your first milk (colostrum) helps your baby's digestive system function better. °· There are antibodies in your milk that help your baby fight off infections. °· Your baby has a lower incidence of asthma, allergies, and sudden infant death syndrome. °· The nutrients in breast milk are better for your baby than infant formulas and are designed uniquely for your baby's needs. °· Breast milk improves your baby's brain development. °· Your baby is less likely to develop other conditions, such as childhood obesity, asthma, or type 2 diabetes mellitus. °For You °· Breastfeeding helps to create a very special bond between you and your baby. °· Breastfeeding is convenient. Breast milk is always available at the correct temperature and costs  nothing. °· Breastfeeding helps to burn calories and helps you lose the weight gained during pregnancy. °· Breastfeeding makes your uterus contract to its prepregnancy size faster and slows bleeding (lochia) after you give birth.   °· Breastfeeding helps to lower your risk of developing type 2 diabetes mellitus, osteoporosis, and breast or ovarian cancer later in life. °SIGNS THAT YOUR BABY IS HUNGRY °Early Signs of Hunger °· Increased alertness or activity. °· Stretching. °· Movement of the head from side to side. °· Movement of the head and opening of the mouth when the corner of the mouth or cheek is stroked (rooting). °· Increased sucking sounds, smacking lips, cooing, sighing, or squeaking. °· Hand-to-mouth movements. °· Increased sucking of fingers or hands. °Late Signs of Hunger °· Fussing. °· Intermittent crying. °Extreme Signs of Hunger °Signs of extreme hunger will require calming and consoling before your baby will be able to breastfeed successfully. Do not wait for the following signs of extreme hunger to occur before you initiate breastfeeding: °· Restlessness. °· A loud, strong cry. °· Screaming. °BREASTFEEDING BASICS °Breastfeeding Initiation °· Find a comfortable place to sit or lie down, with your neck and back well supported. °· Place   a pillow or rolled up blanket under your baby to bring him or her to the level of your breast (if you are seated). Nursing pillows are specially designed to help support your arms and your baby while you breastfeed.  Make sure that your baby's abdomen is facing your abdomen.  Gently massage your breast. With your fingertips, massage from your chest wall toward your nipple in a circular motion. This encourages milk flow. You may need to continue this action during the feeding if your milk flows slowly.  Support your breast with 4 fingers underneath and your thumb above your nipple. Make sure your fingers are well away from your nipple and your baby's  mouth.  Stroke your baby's lips gently with your finger or nipple.  When your baby's mouth is open wide enough, quickly bring your baby to your breast, placing your entire nipple and as much of the colored area around your nipple (areola) as possible into your baby's mouth.  More areola should be visible above your baby's upper lip than below the lower lip.  Your baby's tongue should be between his or her lower gum and your breast.  Ensure that your baby's mouth is correctly positioned around your nipple (latched). Your baby's lips should create a seal on your breast and be turned out (everted).  It is common for your baby to suck about 2-3 minutes in order to start the flow of breast milk. Latching Teaching your baby how to latch on to your breast properly is very important. An improper latch can cause nipple pain and decreased milk supply for you and poor weight gain in your baby. Also, if your baby is not latched onto your nipple properly, he or she may swallow some air during feeding. This can make your baby fussy. Burping your baby when you switch breasts during the feeding can help to get rid of the air. However, teaching your baby to latch on properly is still the best way to prevent fussiness from swallowing air while breastfeeding. Signs that your baby has successfully latched on to your nipple:  Silent tugging or silent sucking, without causing you pain.  Swallowing heard between every 3-4 sucks.  Muscle movement above and in front of his or her ears while sucking. Signs that your baby has not successfully latched on to nipple:  Sucking sounds or smacking sounds from your baby while breastfeeding.  Nipple pain. If you think your baby has not latched on correctly, slip your finger into the corner of your baby's mouth to break the suction and place it between your baby's gums. Attempt breastfeeding initiation again. Signs of Successful Breastfeeding Signs from your baby:  A  gradual decrease in the number of sucks or complete cessation of sucking.  Falling asleep.  Relaxation of his or her body.  Retention of a small amount of milk in his or her mouth.  Letting go of your breast by himself or herself. Signs from you:  Breasts that have increased in firmness, weight, and size 1-3 hours after feeding.  Breasts that are softer immediately after breastfeeding.  Increased milk volume, as well as a change in milk consistency and color by the fifth day of breastfeeding.  Nipples that are not sore, cracked, or bleeding. Signs That Your [employer] is Getting Enough Milk  Wetting at least 3 diapers in a 24-hour period. The urine should be clear and pale yellow by age 60 days.  At least 3 stools in a 24-hour period by age  5 days. The stool should be soft and yellow. °· At least 3 stools in a 24-hour period by age 7 days. The stool should be seedy and yellow. °· No loss of weight greater than 10% of birth weight during the first 3 days of age. °· Average weight gain of 4-7 ounces (113-198 g) per week after age 4 days. °· Consistent daily weight gain by age 5 days, without weight loss after the age of 2 weeks. °After a feeding, your baby may spit up a small amount. This is common. °BREASTFEEDING FREQUENCY AND DURATION °Frequent feeding will help you make more milk and can prevent sore nipples and breast engorgement. Breastfeed when you feel the need to reduce the fullness of your breasts or when your baby shows signs of hunger. This is called "breastfeeding on demand." Avoid introducing a pacifier to your baby while you are working to establish breastfeeding (the first 4-6 weeks after your baby is born). After this time you may choose to use a pacifier. Research has shown that pacifier use during the first year of a baby's life decreases the risk of sudden infant death syndrome (SIDS). °Allow your baby to feed on each breast as long as he or she wants. Breastfeed until your baby is  finished feeding. When your baby unlatches or falls asleep while feeding from the first breast, offer the second breast. Because newborns are often sleepy in the first few weeks of life, you may need to awaken your baby to get him or her to feed. °Breastfeeding times will vary from baby to baby. However, the following rules can serve as a guide to help you ensure that your baby is properly fed: °· Newborns (babies 4 weeks of age or younger) may breastfeed every 1-3 hours. °· Newborns should not go longer than 3 hours during the day or 5 hours during the night without breastfeeding. °· You should breastfeed your baby a minimum of 8 times in a 24-hour period until you begin to introduce solid foods to your baby at around 6 months of age. °BREAST MILK PUMPING °Pumping and storing breast milk allows you to ensure that your baby is exclusively fed your breast milk, even at times when you are unable to breastfeed. This is especially important if you are going back to work while you are still breastfeeding or when you are not able to be present during feedings. Your lactation consultant can give you guidelines on how long it is safe to store breast milk. °A breast pump is a machine that allows you to pump milk from your breast into a sterile bottle. The pumped breast milk can then be stored in a refrigerator or freezer. Some breast pumps are operated by hand, while others use electricity. Ask your lactation consultant which type will work best for you. Breast pumps can be purchased, but some hospitals and breastfeeding support groups lease breast pumps on a monthly basis. A lactation consultant can teach you how to hand express breast milk, if you prefer not to use a pump. °CARING FOR YOUR BREASTS WHILE YOU BREASTFEED °Nipples can become dry, cracked, and sore while breastfeeding. The following recommendations can help keep your breasts moisturized and healthy: °· Avoid using soap on your nipples. °· Wear a supportive bra.  Although not required, special nursing bras and tank tops are designed to allow access to your breasts for breastfeeding without taking off your entire bra or top. Avoid wearing underwire-style bras or extremely tight bras. °· Air dry   your nipples for 3-62mnutes after each feeding.  Use only cotton bra pads to absorb leaked breast milk. Leaking of breast milk between feedings is normal.  Use lanolin on your nipples after breastfeeding. Lanolin helps to maintain your skin's normal moisture barrier. If you use pure lanolin, you do not need to wash it off before feeding your baby again. Pure lanolin is not toxic to your baby. You may also hand express a few drops of breast milk and gently massage that milk into your nipples and allow the milk to air dry. In the first few weeks after giving birth, some women experience extremely full breasts (engorgement). Engorgement can make your breasts feel heavy, warm, and tender to the touch. Engorgement peaks within 3-5 days after you give birth. The following recommendations can help ease engorgement:  Completely empty your breasts while breastfeeding or pumping. You may want to start by applying warm, moist heat (in the shower or with warm water-soaked hand towels) just before feeding or pumping. This increases circulation and helps the milk flow. If your baby does not completely empty your breasts while breastfeeding, pump any extra milk after he or she is finished.  Wear a snug bra (nursing or regular) or tank top for 1-2 days to signal your body to slightly decrease milk production.  Apply ice packs to your breasts, unless this is too uncomfortable for you.  Make sure that your baby is latched on and positioned properly while breastfeeding. If engorgement persists after 48 hours of following these recommendations, contact your health care provider or a lScience writer OVERALL HEALTH CARE RECOMMENDATIONS WHILE BREASTFEEDING  Eat healthy foods.  Alternate between meals and snacks, eating 3 of each per day. Because what you eat affects your breast milk, some of the foods may make your baby more irritable than usual. Avoid eating these foods if you are sure that they are negatively affecting your baby.  Drink milk, fruit juice, and water to satisfy your thirst (about 10 glasses a day).  Rest often, relax, and continue to take your prenatal vitamins to prevent fatigue, stress, and anemia.  Continue breast self-awareness checks.  Avoid chewing and smoking tobacco. Chemicals from cigarettes that pass into breast milk and exposure to secondhand smoke may harm your baby.  Avoid alcohol and drug use, including marijuana. Some medicines that may be harmful to your baby can pass through breast milk. It is important to ask your health care provider before taking any medicine, including all over-the-counter and prescription medicine as well as vitamin and herbal supplements. It is possible to become pregnant while breastfeeding. If birth control is desired, ask your health care provider about options that will be safe for your baby. SEEK MEDICAL CARE IF:  You feel like you want to stop breastfeeding or have become frustrated with breastfeeding.  You have painful breasts or nipples.  Your nipples are cracked or bleeding.  Your breasts are red, tender, or warm.  You have a swollen area on either breast.  You have a fever or chills.  You have nausea or vomiting.  You have drainage other than breast milk from your nipples.  Your breasts do not become full before feedings by the fifth day after you give birth.  You feel sad and depressed.  Your baby is too sleepy to eat well.  Your baby is having trouble sleeping.   Your baby is wetting less than 3 diapers in a 24-hour period.  Your baby has less than 3 stools in  a 24-hour period.  Your baby's skin or the white part of his or her eyes becomes yellow.   Your baby is not gaining  weight by 31 days of age. SEEK IMMEDIATE MEDICAL CARE IF:  Your baby is overly tired (lethargic) and does not want to wake up and feed.  Your baby develops an unexplained fever.   This information is not intended to replace advice given to you by your health care provider. Make sure you discuss any questions you have with your health care provider.   Document Released: 02/05/2005 Document Revised: 10/27/2014 Document Reviewed: 07/30/2012 Elsevier Interactive Patient Education 2016 [employer]. Iron-Rich Diet Iron is a mineral that helps your body to produce hemoglobin. Hemoglobin is a protein in your red blood cells that carries oxygen to your body's tissues. Eating too little iron may cause you to feel weak and tired, and it can increase your risk for infection. Eating enough iron is necessary for your body's metabolism, muscle function, and nervous system. Iron is naturally found in many foods. It can also be added to foods or fortified in foods. There are two types of dietary iron: Heme iron. Heme iron is absorbed by the body more easily than nonheme iron. Heme iron is found in meat, poultry, and fish. Nonheme iron. Nonheme iron is found in dietary supplements, iron-fortified grains, beans, and vegetables. You may need to follow an iron-rich diet if: You have been diagnosed with iron deficiency or iron-deficiency anemia. You have a condition that prevents you from absorbing dietary iron, such as: Infection in your intestines. Celiac disease. This involves long-lasting (chronic) inflammation of your intestines. You do not eat enough iron. You eat a diet that is high in foods that impair iron absorption. You have lost a lot of blood. You have heavy bleeding during your menstrual cycle. You are pregnant. WHAT IS MY PLAN? Your health care provider may help you to determine how much iron you need per day based on your condition. Generally, when a person consumes sufficient amounts of iron  in the diet, the following iron needs are met: Men. 38-7 years old: 11 mg per day. 73-84 years old: 8 mg per day. Women.  85-36 years old: 15 mg per day. 51-67 years old: 18 mg per day. Over 65 years old: 8 mg per day. Pregnant women: 27 mg per day. Breastfeeding women: 9 mg per day. WHAT DO I NEED TO KNOW ABOUT AN IRON-RICH DIET? Eat fresh fruits and vegetables that are high in vitamin C along with foods that are high in iron. This will help increase the amount of iron that your body absorbs from food, especially with foods containing nonheme iron. Foods that are high in vitamin C include oranges, peppers, tomatoes, and mango. Take iron supplements only as directed by your health care provider. Overdose of iron can be life-threatening. If you were prescribed iron supplements, take them with orange juice or a vitamin C supplement. Cook foods in pots and pans that are made from iron.  Eat nonheme iron-containing foods alongside foods that are high in heme iron. This helps to improve your iron absorption.  Certain foods and drinks contain compounds that impair iron absorption. Avoid eating these foods in the same meal as iron-rich foods or with iron supplements. These include: Coffee, black tea, and red wine. Milk, dairy products, and foods that are high in calcium. Beans, soybeans, and peas. Whole grains. When eating foods that contain both nonheme iron and compounds that impair iron  absorption, follow these tips to absorb iron better.  Soak beans overnight before cooking. Soak whole grains overnight and drain them before using. Ferment flours before baking, such as using yeast in bread dough. WHAT FOODS CAN I EAT? Grains Iron-fortified breakfast cereal. Iron-fortified whole-wheat bread. Enriched rice. Sprouted grains. Vegetables Spinach. Potatoes with skin. Green peas. Broccoli. Red and green bell peppers. Fermented vegetables. Fruits Prunes. Raisins. Oranges. Strawberries.  Mango. Grapefruit. Meats and Other Protein Sources Beef liver. Oysters. Beef. Shrimp. [place]. Chicken. [place]. Sardines. Chickpeas. Nuts. Tofu. Beverages Tomato juice. Fresh orange juice. Prune juice. Hibiscus tea. Fortified instant breakfast shakes. Condiments Tahini. Fermented soy sauce. Sweets and Desserts Black-strap molasses.  Other Wheat germ. The items listed above may not be a complete list of recommended foods or beverages. Contact your dietitian for more options. WHAT FOODS ARE NOT RECOMMENDED? Grains Whole grains. Bran cereal. Bran flour. Oats. Vegetables Artichokes. Brussels sprouts. Kale. Fruits Blueberries. Raspberries. Strawberries. Figs. Meats and Other Protein Sources Soybeans. Products made from soy protein. Dairy Milk. Cream. Cheese. Yogurt. Cottage cheese. Beverages Coffee. Black tea. Red wine. Sweets and Desserts Cocoa. Chocolate. Ice cream. Other Basil. Oregano. Parsley. The items listed above may not be a complete list of foods and beverages to avoid. Contact your dietitian for more information.   This information is not intended to replace advice given to you by your health care provider. Make sure you discuss any questions you have with your health care provider.   Document Released: 09/19/2004 Document Revised: 02/26/2014 Document Reviewed: 09/02/2013 Elsevier Interactive Patient Education 2016 [employer]. Postpartum Depression and Baby Blues The postpartum period begins right after the birth of a baby. During this time, there is often a great amount of joy and excitement. It is also a time of many changes in the life of the parents. Regardless of how many times a mother gives birth, each child brings new challenges and dynamics to the family. It is not unusual to have feelings of excitement along with confusing shifts in moods, emotions, and thoughts. All mothers are at risk of developing postpartum depression or the "baby blues." These  mood changes can occur right after giving birth, or they may occur many months after giving birth. The baby blues or postpartum depression can be mild or severe. Additionally, postpartum depression can go away rather quickly, or it can be a long-term condition.  CAUSES Raised hormone levels and the rapid drop in those levels are thought to be a main cause of postpartum depression and the baby blues. A number of hormones change during and after pregnancy. Estrogen and progesterone usually decrease right after the delivery of your baby. The levels of thyroid hormone and various cortisol steroids also rapidly drop. Other factors that play a role in these mood changes include major life events and genetics.  RISK FACTORS If you have any of the following risks for the baby blues or postpartum depression, know what symptoms to watch out for during the postpartum period. Risk factors that may increase the likelihood of getting the baby blues or postpartum depression include:  Having a personal or family history of depression.   Having depression while being pregnant.   Having premenstrual mood issues or mood issues related to oral contraceptives.  Having a lot of life stress.   Having marital conflict.   Lacking a social support network.   Having a baby with special needs.   Having health problems, such as diabetes.  SIGNS AND SYMPTOMS Symptoms of baby blues include:  Brief changes in mood, such as going from extreme happiness to sadness.  Decreased concentration.   Difficulty sleeping.   Crying spells, tearfulness.   Irritability.   Anxiety.  Symptoms of postpartum depression typically begin within the first month after giving birth. These symptoms include:  Difficulty sleeping or excessive sleepiness.   Marked weight loss.   Agitation.   Feelings of worthlessness.   Lack of interest in activity or food.  Postpartum psychosis is a very serious condition and  can be dangerous. Fortunately, it is rare. Displaying any of the following symptoms is cause for immediate medical attention. Symptoms of postpartum psychosis include:   Hallucinations and delusions.   Bizarre or disorganized behavior.   Confusion or disorientation.  DIAGNOSIS  A diagnosis is made by an evaluation of your symptoms. There are no medical or lab tests that lead to a diagnosis, but there are various questionnaires that a health care provider may use to identify those with the baby blues, postpartum depression, or psychosis. Often, a screening tool called the [place] Postnatal Depression Scale is used to diagnose depression in the postpartum period.  TREATMENT The baby blues usually goes away on its own in 1-2 weeks. Social support is often all that is needed. You will be encouraged to get adequate sleep and rest. Occasionally, you may be given medicines to help you sleep.  Postpartum depression requires treatment because it can last several months or longer if it is not treated. Treatment may include individual or group therapy, medicine, or both to address any social, physiological, and psychological factors that may play a role in the depression. Regular exercise, a healthy diet, rest, and social support may also be strongly recommended.  Postpartum psychosis is more serious and needs treatment right away. Hospitalization is often needed. HOME CARE INSTRUCTIONS  Get as much rest as you can. Nap when the baby sleeps.   Exercise regularly. Some women find yoga and walking to be beneficial.   Eat a balanced and nourishing diet.   Do little things that you enjoy. Have a cup of tea, take a bubble bath, read your favorite magazine, or listen to your favorite music.  Avoid alcohol.   Ask for help with household chores, cooking, grocery shopping, or running errands as needed. Do not try to do everything.   Talk to people close to you about how you are feeling. Get support  from your partner, family members, friends, or other new moms.  Try to stay positive in how you think. Think about the things you are grateful for.   Do not spend a lot of time alone.   Only take over-the-counter or prescription medicine as directed by your health care provider.  Keep all your postpartum appointments.   Let your health care provider know if you have any concerns.  SEEK MEDICAL CARE IF: You are having a reaction to or problems with your medicine. SEEK IMMEDIATE MEDICAL CARE IF:  You have suicidal feelings.   You think you may harm the baby or someone else. MAKE SURE YOU:  Understand these instructions.  Will watch your condition.  Will get help right away if you are not doing well or get worse.   This information is not intended to replace advice given to you by your health care provider. Make sure you discuss any questions you have with your health care provider.   Document Released: 11/10/2003 Document Revised: 02/10/2013 Document Reviewed: 11/17/2012 Elsevier Interactive Patient Education 2016 Elsevier Inc. Furosemide tablets What  is this medicine? FUROSEMIDE (fyoor [place] se mide) is a diuretic. It helps you make more urine and to lose salt and excess water from your body. This medicine is used to treat high blood pressure, and edema or swelling from heart, kidney, or liver disease. This medicine may be used for other purposes; ask your health care provider or pharmacist if you have questions. What should I tell my health care provider before I take this medicine? They need to know if you have any of these conditions: -abnormal blood electrolytes -diarrhea or vomiting -gout -heart disease -kidney disease, small amounts of urine, or difficulty passing urine -liver disease -thyroid disease -an unusual or allergic reaction to furosemide, sulfa drugs, other medicines, foods, dyes, or preservatives -pregnant or trying to get pregnant -breast-feeding How  should I use this medicine? Take this medicine by mouth with a glass of water. Follow the directions on the prescription label. You may take this medicine with or without food. If it upsets your stomach, take it with food or milk. Do not take your medicine more often than directed. Remember that you will need to pass more urine after taking this medicine. Do not take your medicine at a time of day that will cause you problems. Do not take at bedtime. Talk to your pediatrician regarding the use of this medicine in children. While this drug may be prescribed for selected conditions, precautions do apply. Overdosage: If you think you have taken too much of this medicine contact a poison control center or emergency room at once. NOTE: This medicine is only for you. Do not share this medicine with others. What if I miss a dose? If you miss a dose, take it as soon as you can. If it is almost time for your next dose, take only that dose. Do not take double or extra doses. What may interact with this medicine? -aspirin and aspirin-like medicines -certain antibiotics -chloral hydrate -cisplatin -cyclosporine -digoxin -diuretics -laxatives -lithium -medicines for blood pressure -medicines that relax muscles for surgery -methotrexate -NSAIDs, medicines for pain and inflammation like ibuprofen, naproxen, or indomethacin -phenytoin -steroid medicines like prednisone or cortisone -sucralfate -thyroid hormones This list may not describe all possible interactions. Give your health care provider a list of all the medicines, herbs, non-prescription drugs, or dietary supplements you use. Also tell them if you smoke, drink alcohol, or use illegal drugs. Some items may interact with your medicine. What should I watch for while using this medicine? Visit your doctor or health care professional for regular checks on your progress. Check your blood pressure regularly. Ask your doctor or health care professional  what your blood pressure should be, and when you should contact him or her. If you are a diabetic, check your blood sugar as directed. You may need to be on a special diet while taking this medicine. Check with your doctor. Also, ask how many glasses of fluid you need to drink a day. You must not get dehydrated. You may get drowsy or dizzy. Do not drive, use machinery, or do anything that needs mental alertness until you know how this drug affects you. Do not stand or sit up quickly, especially if you are an older patient. This reduces the risk of dizzy or fainting spells. Alcohol can make you more drowsy and dizzy. Avoid alcoholic drinks. This medicine can make you more sensitive to the sun. Keep out of the sun. If you cannot avoid being in the sun, wear protective clothing and use sunscreen.  Do not use sun lamps or tanning beds/booths. What side effects may I notice from receiving this medicine? Side effects that you should report to your doctor or health care professional as soon as possible: -blood in urine or stools -dry mouth -fever or chills -hearing loss or ringing in the ears -irregular heartbeat -muscle pain or weakness, cramps -skin rash -stomach upset, pain, or nausea -tingling or numbness in the hands or feet -unusually weak or tired -vomiting or diarrhea -yellowing of the eyes or skin Side effects that usually do not require medical attention (report to your doctor or health care professional if they continue or are bothersome): -headache -loss of appetite -unusual bleeding or bruising This list may not describe all possible side effects. Call your doctor for medical advice about side effects. You may report side effects to FDA at 1-800-FDA-1088. Where should I keep my medicine? Keep out of the reach of children. Store at room temperature between 15 and 30 degrees C (59 and 86 degrees F). Protect from light. Throw away any unused medicine after the expiration date. NOTE: This  sheet is a summary. It may not cover all possible information. If you have questions about this medicine, talk to your doctor, pharmacist, or health care provider.    2016, Elsevier/Gold Standard. (2014-04-28 13:49:50)

## 2015-06-07 ENCOUNTER — Inpatient Hospital Stay (HOSPITAL_COMMUNITY)
Admission: AD | Admit: 2015-06-07 | Discharge: 2015-06-08 | Disposition: A | Discharge status: Home / Self Care | Payer: Medicaid Other | Source: Ambulatory Visit | Attending: Obstetrics and Gynecology | Admitting: Obstetrics and Gynecology

## 2015-06-07 ENCOUNTER — Encounter (HOSPITAL_COMMUNITY): Payer: Self-pay

## 2015-06-07 DIAGNOSIS — T6391XA Toxic effect of contact with unspecified venomous animal, accidental (unintentional), initial encounter: Secondary | ICD-10-CM

## 2015-06-07 DIAGNOSIS — O900 Disruption of cesarean delivery wound: Secondary | ICD-10-CM | POA: Insufficient documentation

## 2015-06-07 DIAGNOSIS — Z91030 Bee allergy status: Secondary | ICD-10-CM | POA: Insufficient documentation

## 2015-06-07 DIAGNOSIS — O1205 Gestational edema, complicating the puerperium: Secondary | ICD-10-CM

## 2015-06-07 DIAGNOSIS — R601 Generalized edema: Secondary | ICD-10-CM

## 2015-06-07 DIAGNOSIS — K439 Ventral hernia without obstruction or gangrene: Secondary | ICD-10-CM

## 2015-06-07 DIAGNOSIS — R34 Anuria and oliguria: Secondary | ICD-10-CM | POA: Insufficient documentation

## 2015-06-07 DIAGNOSIS — I10 Essential (primary) hypertension: Secondary | ICD-10-CM | POA: Insufficient documentation

## 2015-06-07 DIAGNOSIS — Y838 Other surgical procedures as the cause of abnormal reaction of the patient, or of later complication, without mention of misadventure at the time of the procedure: Secondary | ICD-10-CM | POA: Insufficient documentation

## 2015-06-07 DIAGNOSIS — Z888 Allergy status to other drugs, medicaments and biological substances status: Secondary | ICD-10-CM | POA: Insufficient documentation

## 2015-06-07 DIAGNOSIS — S31109A Unspecified open wound of abdominal wall, unspecified quadrant without penetration into peritoneal cavity, initial encounter: Secondary | ICD-10-CM

## 2015-06-07 DIAGNOSIS — Z884 Allergy status to anesthetic agent status: Secondary | ICD-10-CM

## 2015-06-07 NOTE — MAU Note (Signed)
PT  SAYS SHE DEL  C/S-   ON    06-01-2015  BY  DR  Normand SloopILLARD.      PT SAYS HER INCISION      - HALF    OF INCISION OPENED.- DOESN'T KNOW WHEN.  THIS  HAPPENED.  SAYS HAS HAD  DRAINAGE.    SHE  CALLED  KIMBERLY.

## 2015-06-08 DIAGNOSIS — Z888 Allergy status to other drugs, medicaments and biological substances status: Secondary | ICD-10-CM | POA: Diagnosis not present

## 2015-06-08 DIAGNOSIS — T6391XA Toxic effect of contact with unspecified venomous animal, accidental (unintentional), initial encounter: Secondary | ICD-10-CM

## 2015-06-08 DIAGNOSIS — K439 Ventral hernia without obstruction or gangrene: Secondary | ICD-10-CM

## 2015-06-08 DIAGNOSIS — O900 Disruption of cesarean delivery wound: Secondary | ICD-10-CM | POA: Diagnosis present

## 2015-06-08 DIAGNOSIS — I10 Essential (primary) hypertension: Secondary | ICD-10-CM | POA: Diagnosis not present

## 2015-06-08 DIAGNOSIS — R34 Anuria and oliguria: Secondary | ICD-10-CM | POA: Diagnosis not present

## 2015-06-08 DIAGNOSIS — Z91030 Bee allergy status: Secondary | ICD-10-CM | POA: Diagnosis not present

## 2015-06-08 DIAGNOSIS — Y838 Other surgical procedures as the cause of abnormal reaction of the patient, or of later complication, without mention of misadventure at the time of the procedure: Secondary | ICD-10-CM | POA: Diagnosis not present

## 2015-06-08 DIAGNOSIS — Z884 Allergy status to anesthetic agent status: Secondary | ICD-10-CM

## 2015-06-08 DIAGNOSIS — S31109A Unspecified open wound of abdominal wall, unspecified quadrant without penetration into peritoneal cavity, initial encounter: Secondary | ICD-10-CM

## 2015-06-08 LAB — COMPREHENSIVE METABOLIC PANEL
ALBUMIN: 2.6 g/dL — AB (ref 3.5–5.0)
ALK PHOS: 82 U/L (ref 38–126)
ALT: 40 U/L (ref 14–54)
ANION GAP: 6 (ref 5–15)
AST: 29 U/L (ref 15–41)
BILIRUBIN TOTAL: 0.3 mg/dL (ref 0.3–1.2)
BUN: 15 mg/dL (ref 6–20)
CALCIUM: 8.7 mg/dL — AB (ref 8.9–10.3)
CO2: 25 mmol/L (ref 22–32)
Chloride: 107 mmol/L (ref 101–111)
Creatinine, Ser: 0.7 mg/dL (ref 0.44–1.00)
GFR calc Af Amer: >60 mL/min (ref >60)
GLUCOSE: 114 mg/dL — AB (ref 65–99)
Potassium: 3.5 mmol/L (ref 3.5–5.1)
Sodium: 138 mmol/L (ref 135–145)
TOTAL PROTEIN: 6.3 g/dL — AB (ref 6.5–8.1)

## 2015-06-08 MED ORDER — HYDROCHLOROTHIAZIDE 12.5 MG PO CAPS: 12.5000 mg | ORAL_CAPSULE | Freq: Every day | ORAL | Status: DC

## 2015-06-08 MED ORDER — OXYCODONE-ACETAMINOPHEN 5-325 MG PO TABS: 1.0000 | ORAL_TABLET | ORAL | Status: DC | PRN

## 2015-06-08 MED ORDER — IBUPROFEN 600 MG PO TABS
600.0000 mg | ORAL_TABLET | Freq: Once | ORAL | Status: AC
  Administered 2015-06-08: 600 mg via ORAL
  Filled 2015-06-08: qty 1

## 2015-06-08 MED ORDER — OXYCODONE-ACETAMINOPHEN 5-325 MG PO TABS
2.0000 | ORAL_TABLET | Freq: Once | ORAL | Status: AC
  Administered 2015-06-08: 2 via ORAL
  Filled 2015-06-08: qty 2

## 2015-06-08 NOTE — MAU Provider Note (Signed)
History  Regina Nelson is a 25 yo G1 now P1 s/p primary c-section on 06/01/15 for failure to descend, exhaustion, LGA infant, and preE (surgery by Dr. Charlesetta Garibaldi), presents to MAU after calling w/ c/o open incision to R lateral aspect since earlier today. Only yellow substance noted - no drainage or odor. Denies h/a, visual changes, epigastric pain, fevers, chills, SOB, CP or N/V. Reports abdominal tenderness. Pt reportedly received Magnesium Sulfate x 24 hrs postpartum. She states she is not taking any BP medication.  Problem List: Edema in pregnancy, postpartum condition Wound separation, right lateral aspect S/P primary c-section DDD Hernia of abdominal wall HTN Allergies to Bee Venom and Nitrous Oxide  No chief complaint on file.  HPI  As above  OB History    Gravida Para Term Preterm AB TAB SAB Ectopic Multiple Living   '1 1 1      ' 0 1      Past Medical History  Diagnosis Date  . Degenerative disc disease   . STD (female)   . Degenerative joint disease of spine   . Hernia of abdominal wall     Past Surgical History  Procedure Laterality Date  . Cholecystectomy    . Wisdom tooth extraction    . Cesarean section N/A 06/01/2015    Procedure: CESAREAN SECTION;  Surgeon: Crawford Givens, MD;  Location: [facility];  Service: Obstetrics;  Laterality: N/A;    Family History  Problem Relation Age of Onset  . Colon cancer Neg Hx   . Cervical cancer Maternal Aunt     Social History  Substance Use Topics  . Smoking status: Never Smoker   . Smokeless tobacco: Never Used  . Alcohol Use: No     Comment: socially    Allergies:  Allergies  Allergen Reactions  . Bee Venom Anaphylaxis  . Nitrous Oxide Shortness Of Breath    No prescriptions prior to admission    ROS  Per HPI Physical Exam   Results for orders placed or performed during the hospital encounter of 06/07/15 (from the past 48 hour(s))  Comprehensive metabolic panel     Status: Abnormal   Collection Time:  06/08/15 12:49 AM  Result Value Ref Range   Sodium 138 135 - 145 mmol/L   Potassium 3.5 3.5 - 5.1 mmol/L   Chloride 107 101 - 111 mmol/L   CO2 25 22 - 32 mmol/L   Glucose, Bld 114 (H) 65 - 99 mg/dL   BUN 15 6 - 20 mg/dL   Creatinine, Ser 0.70 0.44 - 1.00 mg/dL   Calcium 8.7 (L) 8.9 - 10.3 mg/dL   Total Protein 6.3 (L) 6.5 - 8.1 g/dL   Albumin 2.6 (L) 3.5 - 5.0 g/dL   AST 29 15 - 41 U/L   ALT 40 14 - 54 U/L   Alkaline Phosphatase 82 38 - 126 U/L   Total Bilirubin 0.3 0.3 - 1.2 mg/dL   GFR calc non Af Amer >60 >60 mL/min   GFR calc Af Amer >60 >60 mL/min    Comment: (NOTE) The eGFR has been calculated using the CKD EPI equation. This calculation has not been validated in all clinical situations. eGFR's persistently <60 mL/min signify possible Chronic Kidney Disease.    Anion gap 6 5 - 15   Blood pressure 120/88, pulse 93, temperature 97.3 F (36.3 C), temperature source Oral, resp. rate 18, height '5\' 4"'  (1.626 m), weight 119.863 kg (264 lb 4 oz), unknown if currently breastfeeding.   BP on  arrival was 140/85.   Physical Exam  Gen: NAD. Grimaced w/ manipulation of incision. Lungs: CTAB. CV: RRR w/o M/R/G. Abdomen: Large pannus. Transverse incision intact w/ exception of approximately 1 cm separation right lateral aspect. No odor. Opening probed w/ q-tip and fascia presumed to be intact because resistance met. Incision warm to touch. No erythema.  Ext: 4+ pitting edema from mons pubis to feet - areas painful to touch. Prescribed Lasix 20 mg bid on 06/03/15, continuing 4 days after discharge. Pt reports not urinating a whole lot since on medication. Was supposed to see [facility] Nurse this week, but has not. ED Course  CMET Percocet tabs Ibuprofen tab  Assessment: Minuscule wound separation. Clinically stable. Concerning edema. Elevated BP x 1. Oliguria (serum CR 0.70; remainder of panel normal)  Plan: Consulted Dr. Nelda Marseille who recommended the following: Pt to purchase  TED hose at medical supply store. D/C Lasix and begin HCTZ 12.5 mg daily x 2 wks. May need to establish care w/ Internal Medicine. Office f/u for BP and edema this Friday or early next week. Need to determine whether HCTZ should be continued longer. Refill Percocet. Advised pt to rest when she can, and to elevate feet when sitting. Strict preE s/s. Wound care reviewed. Will have office coordinate visit for [facility] nurse to see pt and schedule office appt for BP/edema evaluation. D/C'd home in stable condition.  Farrel Gordon CNM, MS 06/08/15, 01:14 AM

## 2015-06-08 NOTE — Discharge Instructions (Signed)
Edema °Edema is an abnormal buildup of fluids. It is more common in your legs and thighs. Painless swelling of the feet and ankles is more likely as a person ages. It also is common in looser skin, like around your eyes. °HOME CARE  °· Keep the affected body part above the level of the heart while lying down. °· Do not sit still or stand for a long time. °· Do not put anything right under your knees when you lie down. °· Do not wear tight clothes on your upper legs. °· Exercise your legs to help the puffiness (swelling) go down. °· Wear elastic bandages or support stockings as told by your doctor. °· A low-salt diet may help lessen the puffiness. °· Only take medicine as told by your doctor. °GET HELP IF: °· Treatment is not working. °· You have heart, liver, or kidney disease and notice that your skin looks puffy or shiny. °· You have puffiness in your legs that does not get better when you raise your legs. °· You have sudden weight gain for no reason. °GET HELP RIGHT AWAY IF:  °· You have shortness of breath or chest pain. °· You cannot breathe when you lie down. °· You have pain, redness, or warmth in the areas that are puffy. °· You have heart, liver, or kidney disease and get edema all of a sudden. °· You have a fever and your symptoms get worse all of a sudden. °MAKE SURE YOU:  °· Understand these instructions. °· Will watch your condition. °· Will get help right away if you are not doing well or get worse. °  °This information is not intended to replace advice given to you by your health care provider. Make sure you discuss any questions you have with your health care provider. °  °Document Released: 07/25/2007 Document Revised: 02/10/2013 Document Reviewed: 11/28/2012 °Elsevier Interactive Patient Education ©2016 Elsevier Inc. ° °

## 2015-06-09 ENCOUNTER — Encounter (HOSPITAL_COMMUNITY): Payer: Self-pay

## 2015-06-09 ENCOUNTER — Inpatient Hospital Stay (HOSPITAL_COMMUNITY)
Admission: AD | Admit: 2015-06-09 | Discharge: 2015-06-09 | Disposition: A | Discharge status: Home / Self Care | Payer: Medicaid Other | Source: Ambulatory Visit | Attending: Obstetrics and Gynecology | Admitting: Obstetrics and Gynecology

## 2015-06-09 DIAGNOSIS — O900 Disruption of cesarean delivery wound: Secondary | ICD-10-CM | POA: Insufficient documentation

## 2015-06-09 DIAGNOSIS — S31109D Unspecified open wound of abdominal wall, unspecified quadrant without penetration into peritoneal cavity, subsequent encounter: Secondary | ICD-10-CM

## 2015-06-09 DIAGNOSIS — R609 Edema, unspecified: Secondary | ICD-10-CM | POA: Diagnosis not present

## 2015-06-09 DIAGNOSIS — E669 Obesity, unspecified: Secondary | ICD-10-CM | POA: Insufficient documentation

## 2015-06-09 DIAGNOSIS — Y838 Other surgical procedures as the cause of abnormal reaction of the patient, or of later complication, without mention of misadventure at the time of the procedure: Secondary | ICD-10-CM | POA: Insufficient documentation

## 2015-06-09 DIAGNOSIS — R10819 Abdominal tenderness, unspecified site: Secondary | ICD-10-CM | POA: Diagnosis present

## 2015-06-09 MED ORDER — CEPHALEXIN 500 MG PO CAPS: 500.0000 mg | ORAL_CAPSULE | Freq: Two times a day (BID) | ORAL | Status: DC

## 2015-06-09 NOTE — MAU Provider Note (Signed)
The patient had a cesarean section on 06/01/2015. Her delivery was complicated by preeclampsia. She has had severe edema after her procedure. She presents today for evaluation of her incision. The patient was started on hydrochlorothiazide for significant edema. She reports that she feels much better than she did yesterday.  Subjective:  The patient complains of tenderness in her panniculus.  Objective:  BP 131/90 mmHg  Pulse 88  Temp(Src) 98.7 F (37.1 C) (Oral)  Resp 18  LMP  (LMP Unknown)  CBC    Component Value Date/Time   WBC 17.7* 06/01/2015 0435   RBC 4.10 06/01/2015 0435   HGB 10.9* 06/01/2015 0435   HCT 32.6* 06/01/2015 0435   PLT 194 06/01/2015 0435   MCV 79.5 06/01/2015 0435   MCH 26.6 06/01/2015 0435   MCHC 33.4 06/01/2015 0435   RDW 13.5 06/01/2015 0435   LYMPHSABS 3.0 01/22/2013 0121   MONOABS 1.0 01/22/2013 0121   EOSABS 0.1 01/22/2013 0121   BASOSABS 0.0 01/22/2013 0121    Abdomen:  There is induration in her panniculus. The area is slightly tender. There is no erythema. The incision is open to the right of the incision. I do not see purulent drainage. The incision was opened. Incision was clean with peroxide and then saline. The incision was packed with saline soaked gauze. She tolerated her procedure well.  Extremities:  The patient has 3+ edema in her lower extremity. There is no signs of deep venous thrombosis.  Assessment:  Postoperative wound separation.  Significant edema  Obesity  Plan:  The patient was instructed to clean her incision twice each day with peroxide and saline. She will pack her incision with gauze. The mother was trained how to do her procedure. They are comfortable doing this at home.  The patient will continue hydrochlorothiazide for diuresis.  We will treat the patient with Keflex 500 mg twice each day for 7 days.  The patient will return to office in 3-4 days for follow-up evaluation. We'll call with questions or  concerns.  Dr. Stefano GaulStringer  06/09/2015

## 2015-06-09 NOTE — MAU Note (Addendum)
Pt had C/S on 4/12, was seen by home health nurse - was told her incision is warm to touch, yellow/pink drainage, odor. Had preeclampsia, feet are swollen, denies HA.

## 2015-06-12 LAB — WOUND CULTURE

## 2015-06-14 ENCOUNTER — Encounter (HOSPITAL_COMMUNITY): Payer: Self-pay | Admitting: Obstetrics and Gynecology

## 2015-06-14 NOTE — Addendum Note (Signed)
Addendum  created 06/14/15 0447 by Jairo Benarswell Rashaun Wichert, MD   Modules edited: Anesthesia Events, Narrator   Narrator:  Narrator: Event Log Edited

## 2015-06-20 ENCOUNTER — Encounter (HOSPITAL_COMMUNITY): Payer: Self-pay | Admitting: *Deleted

## 2015-08-26 ENCOUNTER — Encounter (HOSPITAL_BASED_OUTPATIENT_CLINIC_OR_DEPARTMENT_OTHER): Payer: Self-pay

## 2015-08-26 ENCOUNTER — Emergency Department (HOSPITAL_BASED_OUTPATIENT_CLINIC_OR_DEPARTMENT_OTHER)
Admission: EM | Admit: 2015-08-26 | Discharge: 2015-08-26 | Disposition: A | Discharge status: Home / Self Care | Payer: Medicaid Other | Attending: Emergency Medicine | Admitting: Emergency Medicine

## 2015-08-26 DIAGNOSIS — M5441 Lumbago with sciatica, right side: Secondary | ICD-10-CM | POA: Insufficient documentation

## 2015-08-26 MED ORDER — PREDNISONE 20 MG PO TABS
60.0000 mg | ORAL_TABLET | Freq: Every day | ORAL | Status: DC
Start: 2015-08-26 — End: 2017-01-08

## 2015-08-26 MED ORDER — NAPROXEN 500 MG PO TABS: 500.0000 mg | ORAL_TABLET | Freq: Two times a day (BID) | ORAL | Status: DC

## 2015-08-26 MED ORDER — NAPROXEN 500 MG PO TABS
500.0000 mg | ORAL_TABLET | Freq: Two times a day (BID) | ORAL | Status: DC
Start: 2015-08-26 — End: 2017-01-08

## 2015-08-26 MED ORDER — PREDNISONE 20 MG PO TABS: 60.0000 mg | ORAL_TABLET | Freq: Every day | ORAL | Status: DC

## 2015-08-26 NOTE — ED Provider Notes (Signed)
CSN: [id number]     Arrival date & time 08/26/15  1441 History   First MD Initiated Contact with Patient 08/26/15 1459     Chief Complaint  Patient presents with  . Back Pain   (Consider location/radiation/quality/duration/timing/severity/associated sxs/prior Treatment) HPI 25 y.o. female with a hx DDD, presents to the Emergency Department today complaining of lower back pain X 2 weeks. States pain is bilateral low back with minimal spinous process tenderness. Noted pain is worse with movement. States pain is 8/10 and throbbing sensation. Pt notes that she get "shooting" sensations down the right leg that feel like pins and needles. Pt has history of DDD when she was 25 years old. Does not seen anyone for this. No fevers. No Hx Iv Drug use. No N/V/D. No loss of bowel or bladder function. No saddle anesthesia. Has tried OTC remedies with minimal relief. No dysuria. No vaginal bleeding/discharge. No other symptoms noted.   Past Medical History  Diagnosis Date  . Degenerative disc disease   . STD (female)   . Degenerative joint disease of spine   . Hernia of abdominal wall    Past Surgical History  Procedure Laterality Date  . Cholecystectomy    . Wisdom tooth extraction    . Cesarean section N/A 06/01/2015    Procedure: CESAREAN SECTION;  Surgeon: Jaymes GraffNaima Dillard, MD;  Location: [facility];  Service: Obstetrics;  Laterality: N/A;   Family History  Problem Relation Age of Onset  . Colon cancer Neg Hx   . Cervical cancer Maternal Aunt    Social History  Substance Use Topics  . Smoking status: Never Smoker   . Smokeless tobacco: Never Used  . Alcohol Use: No   OB History    Gravida Para Term Preterm AB TAB SAB Ectopic Multiple Living   1 1 1       0 1     Review of Systems  Constitutional: Negative for fever.  Gastrointestinal: Negative for nausea and vomiting.  Genitourinary: Negative for dysuria.  Musculoskeletal: Positive for back pain.  Neurological: Negative for headaches.    Allergies  Bee venom and Nitrous oxide  Home Medications   Prior to Admission medications   Not on File   BP 124/78 mmHg  Pulse 77  Temp(Src) 98.4 F (36.9 C) (Oral)  Resp 16  Ht 5\' 6"  (1.676 m)  Wt 107.049 kg  BMI 38.11 kg/m2  SpO2 100%  LMP  (LMP Unknown)  Breastfeeding? No   Physical Exam  Constitutional: She is oriented to person, place, and time. She appears well-developed and well-nourished.  HENT:  Head: Normocephalic and atraumatic.  Eyes: EOM are normal.  Neck: Normal range of motion. Neck supple.  Cardiovascular: Normal rate, regular rhythm, normal heart sounds and intact distal pulses.   Pulmonary/Chest: Effort normal and breath sounds normal.  Abdominal: Soft. Normal appearance and bowel sounds are normal. There is no tenderness. There is no rigidity, no rebound, no guarding, no tenderness at McBurney's point and negative Murphy's sign.  Musculoskeletal: Normal range of motion.       Lumbar back: She exhibits normal range of motion, no tenderness, no swelling, no edema, no deformity and no pain.  TTP Bilateral low back along musculature. ROM intact. Able to ambulate without difficulty. Has positive straight leg raise on right side. Neurovascularly intact.   Neurological: She is alert and oriented to person, place, and time.  Skin: Skin is warm and dry.  Psychiatric: She has a normal mood and affect. Her behavior  is normal. Thought content normal.  Nursing note and vitals reviewed.  ED Course  Procedures (including critical care time) Labs Review Labs Reviewed - No data to display  Imaging Review No results found. I have personally reviewed and evaluated these images and lab results as part of my medical decision-making.   EKG Interpretation None      MDM  I have reviewed the relevant previous healthcare records. I obtained HPI from historian.  ED Course:  Assessment: Patient is a 24Fwith a hx of DDD who presents to the ED with back pain. No  neurological deficits appreciated. Patient is ambulatory. No warning symptoms of back pain including: fecal incontinence, urinary retention or overflow incontinence, night sweats, waking from sleep with back pain, unexplained fevers or weight loss, h/o cancer, IVDU, recent trauma. No concern for cauda equina, epidural abscess, or other serious cause of back pain. Given Rx Prednisone. Conservative measures such as rest, ice/heat and pain medicine indicated with PCP follow-up if no improvement with conservative management. Recommended outpatient MRI for further evaluation of symptoms.   Disposition/Plan:  DC Home Additional Verbal discharge instructions given and discussed with patient.  Pt Instructed to f/u with PCP in the next week for evaluation and treatment of symptoms. Return precautions given Pt acknowledges and agrees with plan  Supervising Physician No att. providers found   Final diagnoses:  Bilateral low back pain with right-sided sciatica     Audry Piliyler Andjela Wickes, PA-C 08/26/15 1538  Doug SouSam Jacubowitz, MD 08/26/15 2330

## 2015-08-26 NOTE — ED Notes (Signed)
C/o lower back pain since age 25-worse x 2 weeks-NAD-steady gait

## 2015-08-26 NOTE — Discharge Instructions (Signed)
Please read and follow all provided instructions.  Your diagnoses today include:  1. Bilateral low back pain with right-sided sciatica    Tests performed today include:  Vital signs. See below for your results today.   Medications prescribed:   Take as prescribed   Home care instructions:  Follow any educational materials contained in this packet.  Follow-up instructions: Please follow-up with your primary care provider for further evaluation of symptoms and treatment   Return instructions:   Please return to the Emergency Department if you do not get better, if you get worse, or new symptoms OR  - Fever (temperature greater than 101.29F)  - Bleeding that does not stop with holding pressure to the area    -Severe pain (please note that you may be more sore the day after your accident)  - Chest Pain  - Difficulty breathing  - Severe nausea or vomiting  - Inability to tolerate food and liquids  - Passing out  - Skin becoming red around your wounds  - Change in mental status (confusion or lethargy)  - New numbness or weakness     Please return if you have any other emergent concerns.  Additional Information:  Your vital signs today were: BP 124/78 mmHg   Pulse 77   Temp(Src) 98.4 F (36.9 C) (Oral)   Resp 16   Ht 5\' 6"  (1.676 m)   Wt 107.049 kg   BMI 38.11 kg/m2   SpO2 100%   LMP  (LMP Unknown)   Breastfeeding? No If your blood pressure (BP) was elevated above 135/85 this visit, please have this repeated by your doctor within one month. ---------------

## 2015-10-18 ENCOUNTER — Emergency Department (HOSPITAL_BASED_OUTPATIENT_CLINIC_OR_DEPARTMENT_OTHER)
Admission: EM | Admit: 2015-10-18 | Discharge: 2015-10-19 | Disposition: A | Discharge status: Home / Self Care | Payer: Medicaid Other | Attending: Emergency Medicine | Admitting: Emergency Medicine

## 2015-10-18 ENCOUNTER — Encounter (HOSPITAL_BASED_OUTPATIENT_CLINIC_OR_DEPARTMENT_OTHER): Payer: Self-pay | Admitting: *Deleted

## 2015-10-18 DIAGNOSIS — N939 Abnormal uterine and vaginal bleeding, unspecified: Secondary | ICD-10-CM

## 2015-10-18 DIAGNOSIS — R102 Pelvic and perineal pain: Secondary | ICD-10-CM | POA: Insufficient documentation

## 2015-10-18 LAB — CBC WITH DIFFERENTIAL/PLATELET
Basophils Absolute: 0 10*3/uL (ref 0.0–0.1)
Basophils Relative: 0 %
EOS ABS: 0.1 10*3/uL (ref 0.0–0.7)
EOS PCT: 1 %
HCT: 41.2 % (ref 36.0–46.0)
Hemoglobin: 13.7 g/dL (ref 12.0–15.0)
LYMPHS ABS: 3.1 10*3/uL (ref 0.7–4.0)
Lymphocytes Relative: 39 %
MCH: 26 pg (ref 26.0–34.0)
MCHC: 33.3 g/dL (ref 30.0–36.0)
MCV: 78.3 fL (ref 78.0–100.0)
Monocytes Absolute: 0.6 10*3/uL (ref 0.1–1.0)
Monocytes Relative: 7 %
Neutro Abs: 4.2 10*3/uL (ref 1.7–7.7)
Neutrophils Relative %: 53 %
Platelets: 246 10*3/uL (ref 150–400)
RBC: 5.26 MIL/uL — AB (ref 3.87–5.11)
RDW: 15.3 % (ref 11.5–15.5)
WBC: 8 10*3/uL (ref 4.0–10.5)

## 2015-10-18 LAB — WET PREP, GENITAL
CLUE CELLS WET PREP: NONE SEEN
SPERM: NONE SEEN
Trich, Wet Prep: NONE SEEN
Yeast Wet Prep HPF POC: NONE SEEN

## 2015-10-18 LAB — HCG, QUANTITATIVE, PREGNANCY: hCG, Beta Chain, Quant, S: <1 m[IU]/mL (ref <5)

## 2015-10-18 NOTE — ED Provider Notes (Signed)
[facility]   CSN: [id number] Arrival date & time: 10/18/15  1845  By signing my name below, I, Regina Nelson, attest that this documentation has been prepared under the direction and in the presence of non-physician practitioner, Regina MarlinJessica Jodye Scali, PA-C. Electronically Signed: Nelwyn SalisburyJoshua Nelson, Scribe. 10/18/2015. 9:09 PM.   History   Chief Complaint Chief Complaint  Patient presents with  . Vaginal Bleeding      No cva tender.  The history is provided by the patient. No language interpreter was used.    HPI Comments:  Regina PersonsDeonna Rochelle Nelson is a 25 y.o. female who presents to the Emergency Department complaining of sudden-onset constant severe vaginal bleeding onset two days ago. Pt reports she thought it was just her normal period with an unusually heavy flow. She reports she bled through a pad, her underwear the first day. Pt notes that on the second day it took one hour to bleed through a super tampon, a pad, and her underwear, onto her pants. She states the bleeding has not let up at all in the past three days. Pt endorses associated Suprapubic cramping, and dizziness occurring today. Pt states that her periods, including her LNMP, last three days. She notes that she has had irregular periods in the past, particularly while she was on birth control.  Pt notes she gave birth 4 months ago by c-section. History of preeclampsia. Pt is sexually active. She denies any fever, chills, nausea, or vomiting. Pt is not breast-feeding currently.  She has taken motrin with no relief.  Past Medical History:  Diagnosis Date  . Degenerative disc disease   . Degenerative joint disease of spine   . Hernia of abdominal wall   . STD (female)     Patient Active Problem List   Diagnosis Date Noted  . Wound, open, abdominal wall, lateral 06/08/2015  . Hernia of abdominal wall 06/08/2015  . 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 Allergy or toxic  reaction to venom 06/08/2015  . Edema in pregnancy, postpartum condition 06/07/2015  . Status post primary low transverse cesarean section--FTD 06/02/2015  . Pre-eclampsia in third trimester 06/02/2015  . Pregnancy induced hypertension 05/31/2015  . Morbid obesity with BMI of 40.0-44.9, adult (HCC) 05/20/2015  . Diastasis recti 05/20/2015  . Hx of peptic ulcer 05/20/2015    Past Surgical History:  Procedure Laterality Date  . CESAREAN SECTION N/A 06/01/2015   Procedure: CESAREAN SECTION;  Surgeon: Jaymes GraffNaima Dillard, MD;  Location: [facility];  Service: Obstetrics;  Laterality: N/A;  . CHOLECYSTECTOMY    . WISDOM TOOTH EXTRACTION      OB History    Gravida Para Term Preterm AB Living   1 1 1     1    SAB TAB Ectopic Multiple Live Births         0 1       Home Medications    Prior to Admission medications   Medication Sig Start Date End Date Taking? Authorizing Provider  naproxen (NAPROSYN) 500 MG tablet Take 1 tablet (500 mg total) by mouth 2 (two) times daily. 08/26/15   Audry Piliyler Mohr, PA-C  predniSONE (DELTASONE) 20 MG tablet Take 3 tablets (60 mg total) by mouth daily with breakfast. 08/26/15   Audry Piliyler Mohr, PA-C    Family History Family History  Problem Relation Age of Onset  . Cervical cancer Maternal Aunt   . Colon cancer Neg Hx     Social History Social History  Substance Use  Topics  . Smoking status: Never Smoker  . Smokeless tobacco: Never Used  . Alcohol use No     Allergies   Bee venom and Nitrous oxide   Review of Systems Review of Systems  Constitutional: Negative for chills and fever.  Gastrointestinal: Positive for abdominal pain. Negative for nausea and vomiting.  Genitourinary: Positive for vaginal bleeding.     Physical Exam Updated Vital Signs BP 117/80   Pulse 67   Temp 97.9 F (36.6 C) (Oral)   Resp 16   Ht 5\' 6"  (1.676 m)   Wt 236 lb (107 kg)   SpO2 98%   BMI 38.09 kg/m   Physical Exam  Constitutional: She appears well-developed and  well-nourished. No distress.  HENT:  Head: Normocephalic and atraumatic.  Eyes: Conjunctivae are normal.  Cardiovascular: Normal rate, regular rhythm and normal heart sounds.  Exam reveals no gallop and no friction rub.   No murmur heard. Pulmonary/Chest: Effort normal and breath sounds normal. No respiratory distress. She has no decreased breath sounds. She has no wheezes. She has no rhonchi. She has no rales.  Abdominal: Soft. Normal appearance. She exhibits no distension and no mass. There is tenderness in the suprapubic area. There is no rigidity, no rebound, no guarding and no CVA tenderness.  Genitourinary:  Genitourinary Comments: Exam performed by Jerre Simon,  exam chaperoned Date: 10/19/2015 Pelvic exam: normal external genitalia without evidence of trauma. VULVA: normal appearing vulva with no masses, tenderness or lesion. VAGINA: normal appearing vagina with normal color and discharge, no lesions. CERVIX: normal appearing cervix without lesions, cervical motion tenderness absent, cervical os closed with out purulent discharge; vaginal discharge - clear, bloody and mucoid, Wet prep and DNA probe for chlamydia and GC obtained.   ADNEXA: normal adnexa in size, nontender and no masses UTERUS: uterus is normal size, shape, consistency and nontender.    Musculoskeletal: Normal range of motion.  Neurological: She is alert. Coordination normal.  Skin: Skin is warm and dry. She is not diaphoretic.  Psychiatric: She has a normal mood and affect. Her behavior is normal.  Nursing note and vitals reviewed.    ED Treatments / Results  DIAGNOSTIC STUDIES:  Oxygen Saturation is 98% on RA, normal by my interpretation.    COORDINATION OF CARE:  9:25 PM Discussed treatment plan with pt at bedside which included pelvic exam and pt agreed to plan.  Labs (all labs ordered are listed, but only abnormal results are displayed) Labs Reviewed  WET PREP, GENITAL - Abnormal; Notable for the  following:       Result Value   WBC, Wet Prep HPF POC FEW (*)    All other components within normal limits  CBC WITH DIFFERENTIAL/PLATELET - Abnormal; Notable for the following:    RBC 5.26 (*)    All other components within normal limits  HCG, QUANTITATIVE, PREGNANCY  RPR  HIV ANTIBODY (ROUTINE TESTING)  URINALYSIS, ROUTINE W REFLEX MICROSCOPIC (NOT AT [facility])  GC/CHLAMYDIA PROBE AMP ([place]) NOT AT [facility]    EKG  EKG Interpretation None       Radiology No results found.  Procedures Procedures (including critical care time)  Medications Ordered in ED Medications - No data to display   Initial Impression / Assessment and Plan / ED Course  I have reviewed the triage vital signs and the nursing notes.  Pertinent labs & imaging results that were available during my care of the patient were reviewed by me and considered in my  medical decision making (see chart for details).  Clinical Course   Patient presents with heavy vaginal bleeding. Labs unremarkable. Gonorrhea, chlamydia, HIV, RPR pending. Patient without abnormal vaginal discharge and will not treat for gonorrhea or chlamydia at this time. Patient is aware that these cultures are pending. CBC unremarkable. Bleeding is likely due to first menstrual period After pregnancy. Instructed patient to follow with the [facility] women's health clinic to be seen this week regarding her heavy bleeding. Discussed strict return precautions. Patient expressed understanding to the discharge instructions.  Patient case discussed with Dr. Clydene Pugh who agrees with the above plan.  I personally performed the services described in this documentation, which was scribed in my presence. The recorded information has been reviewed and is accurate.    Final Clinical Impressions(s) / ED Diagnoses   Final diagnoses:  Abnormal vaginal bleeding    New Prescriptions Discharge Medication List as of 10/19/2015 12:03 AM       Jerre Simon,  PA 10/19/15 0111    Lyndal Pulley, MD 10/19/15 [phone]

## 2015-10-18 NOTE — ED Triage Notes (Signed)
Vaginal bleeding x 2 days. States it was heavy. She has a 604 month old baby. Her menses have always been irregular so this could be her menses per pt.

## 2015-10-19 NOTE — Discharge Instructions (Signed)
Follow-up with the [place]'s outpatient clinic in [place] to be seen within 2-3 days regarding your visit to the emergency department today. Your gonorrhea, chlamydia, syphilis, HIV testing is still pending.  Return to the emergency department if you experience worsening bleeding, worsening abdominal cramping, nausea, vomiting, abdominal pain, fever, you pass out, or any other concerning symptoms.

## 2015-10-19 NOTE — ED Notes (Addendum)
Back in room to discharge patient-gown on bed.  Patient eloped prior to discharge paperwork.  Unable to obtain vitals or review discharge paperwork with patient.

## 2015-10-20 LAB — HIV ANTIBODY (ROUTINE TESTING W REFLEX): HIV SCREEN 4TH GENERATION: NONREACTIVE

## 2015-10-20 LAB — GC/CHLAMYDIA PROBE AMP (~~LOC~~) NOT AT ARMC
Chlamydia: NEGATIVE
Neisseria Gonorrhea: NEGATIVE

## 2015-10-20 LAB — RPR: RPR Ser Ql: NONREACTIVE

## 2016-02-26 ENCOUNTER — Encounter (HOSPITAL_BASED_OUTPATIENT_CLINIC_OR_DEPARTMENT_OTHER): Payer: Self-pay | Admitting: Emergency Medicine

## 2016-02-26 ENCOUNTER — Emergency Department (HOSPITAL_BASED_OUTPATIENT_CLINIC_OR_DEPARTMENT_OTHER): Payer: Self-pay

## 2016-02-26 ENCOUNTER — Emergency Department (HOSPITAL_BASED_OUTPATIENT_CLINIC_OR_DEPARTMENT_OTHER)
Admission: EM | Admit: 2016-02-26 | Discharge: 2016-02-26 | Disposition: A | Discharge status: Home / Self Care | Payer: Self-pay | Attending: Emergency Medicine | Admitting: Emergency Medicine

## 2016-02-26 DIAGNOSIS — Y9301 Activity, walking, marching and hiking: Secondary | ICD-10-CM | POA: Insufficient documentation

## 2016-02-26 DIAGNOSIS — Y999 Unspecified external cause status: Secondary | ICD-10-CM | POA: Insufficient documentation

## 2016-02-26 DIAGNOSIS — W19XXXA Unspecified fall, initial encounter: Secondary | ICD-10-CM

## 2016-02-26 DIAGNOSIS — S93492A Sprain of other ligament of left ankle, initial encounter: Secondary | ICD-10-CM | POA: Insufficient documentation

## 2016-02-26 DIAGNOSIS — W101XXA Fall (on)(from) sidewalk curb, initial encounter: Secondary | ICD-10-CM | POA: Insufficient documentation

## 2016-02-26 DIAGNOSIS — Y929 Unspecified place or not applicable: Secondary | ICD-10-CM | POA: Insufficient documentation

## 2016-02-26 MED ORDER — DICLOFENAC SODIUM 1 % TD GEL: 4.0000 g | Freq: Four times a day (QID) | TRANSDERMAL | 0 refills | Status: DC

## 2016-02-26 MED ORDER — IBUPROFEN 400 MG PO TABS
400.0000 mg | ORAL_TABLET | Freq: Once | ORAL | Status: AC | PRN
  Administered 2016-02-26: 400 mg via ORAL
  Filled 2016-02-26: qty 1

## 2016-02-26 MED ORDER — IBUPROFEN 800 MG PO TABS: 800.0000 mg | ORAL_TABLET | Freq: Four times a day (QID) | ORAL | 0 refills | Status: AC | PRN

## 2016-02-26 MED ORDER — IBUPROFEN 400 MG PO TABS
400.0000 mg | ORAL_TABLET | Freq: Once | ORAL | Status: AC
  Administered 2016-02-26: 400 mg via ORAL
  Filled 2016-02-26: qty 1

## 2016-02-26 NOTE — ED Triage Notes (Signed)
Regina Nelson last night walking on the curb and felt it pop. Pain to left ankle with swelling noted. No LOC

## 2016-02-26 NOTE — ED Provider Notes (Signed)
[facility]   CSN: [id number] Arrival date & time: 02/26/16  1207     History   Chief Complaint Chief Complaint  Patient presents with  . Ankle Pain    HPI Regina Nelson is a 26 y.o. female.  The history is provided by the patient.  Ankle Pain   The incident occurred yesterday. Incident location: parking lot. Injury mechanism: inversion. The pain is present in the left ankle. The quality of the pain is described as aching. The pain is moderate. The pain has been constant since onset. Pertinent negatives include no inability to bear weight. Associated symptoms comments: pain. The symptoms are aggravated by bearing weight. She has tried nothing for the symptoms.    Past Medical History:  Diagnosis Date  . Degenerative disc disease   . Degenerative joint disease of spine   . Hernia of abdominal wall   . STD (female)     Patient Active Problem List   Diagnosis Date Noted  . Wound, open, abdominal wall, lateral 06/08/2015  . Hernia of abdominal wall 06/08/2015  . 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 Allergy or toxic reaction to venom 06/08/2015  . Edema in pregnancy, postpartum condition 06/07/2015  . Status post primary low transverse cesarean section--FTD 06/02/2015  . Pre-eclampsia in third trimester 06/02/2015  . Pregnancy induced hypertension 05/31/2015  . Morbid obesity with BMI of 40.0-44.9, adult (HCC) 05/20/2015  . Diastasis recti 05/20/2015  . Hx of peptic ulcer 05/20/2015    Past Surgical History:  Procedure Laterality Date  . CESAREAN SECTION N/A 06/01/2015   Procedure: CESAREAN SECTION;  Surgeon: Jaymes Graff, MD;  Location: [facility];  Service: Obstetrics;  Laterality: N/A;  . CHOLECYSTECTOMY    . WISDOM TOOTH EXTRACTION      OB History    Gravida Para Term Preterm AB Living   1 1 1     1    SAB TAB Ectopic Multiple Live Births         0 1       Home Medications    Prior to Admission  medications   Medication Sig Start Date End Date Taking? Authorizing Provider  diclofenac sodium (VOLTAREN) 1 % GEL Apply 4 g topically 4 (four) times daily. 02/26/16   Lyndal Pulley, MD  ibuprofen (ADVIL,MOTRIN) 800 MG tablet Take 1 tablet (800 mg total) by mouth every 6 (six) hours as needed. 02/26/16 03/04/16  Lyndal Pulley, MD  naproxen (NAPROSYN) 500 MG tablet Take 1 tablet (500 mg total) by mouth 2 (two) times daily. 08/26/15   Audry Pili, PA-C  predniSONE (DELTASONE) 20 MG tablet Take 3 tablets (60 mg total) by mouth daily with breakfast. 08/26/15   Audry Pili, PA-C    Family History Family History  Problem Relation Age of Onset  . Cervical cancer Maternal Aunt   . Colon cancer Neg Hx     Social History Social History  Substance Use Topics  . Smoking status: Never Smoker  . Smokeless tobacco: Never Used  . Alcohol use No     Allergies   Bee venom and Nitrous oxide   Review of Systems Review of Systems  All other systems reviewed and are negative.    Physical Exam Updated Vital Signs BP 115/68 (BP Location: Right Arm)   Pulse (!) 58   Temp 98.1 F (36.7 C)   Resp 18   Ht 5\' 6"  (1.676 m)   Wt 220 lb (99.8 kg)  LMP 01/19/2016 Comment: Irregular Periods  SpO2 100%   Breastfeeding? No   BMI 35.51 kg/m   Physical Exam  Constitutional: She is oriented to person, place, and time. She appears well-developed and well-nourished. No distress.  HENT:  Head: Normocephalic.  Nose: Nose normal.  Eyes: Conjunctivae are normal.  Neck: Neck supple. No tracheal deviation present.  Cardiovascular: Normal rate and regular rhythm.   Pulmonary/Chest: Effort normal. No respiratory distress.  Abdominal: Soft. She exhibits no distension.  Musculoskeletal:       Left ankle: She exhibits swelling (minimal lateral). She exhibits normal range of motion. Tenderness. AITFL tenderness found.  Ligaments stable to confrontation  Neurological: She is alert and oriented to person, place, and  time.  Skin: Skin is warm and dry.  Psychiatric: She has a normal mood and affect.     ED Treatments / Results  Labs (all labs ordered are listed, but only abnormal results are displayed) Labs Reviewed - No data to display  EKG  EKG Interpretation None       Radiology Dg Ankle Complete Left  Result Date: 02/26/2016 CLINICAL DATA:  Fall yesterday with left ankle pain and swelling, initial encounter EXAM: LEFT ANKLE COMPLETE - 3+ VIEW COMPARISON:  None. FINDINGS: No acute fracture or dislocation is noted. Generalized soft tissue swelling is noted related to the recent injury. IMPRESSION: Soft tissue swelling without acute bony abnormality. Electronically Signed   By: Alcide CleverMark  Lukens M.D.   On: 02/26/2016 13:11    Procedures Procedures (including critical care time)  Medications Ordered in ED Medications  ibuprofen (ADVIL,MOTRIN) tablet 400 mg (400 mg Oral Given 02/26/16 1406)  ibuprofen (ADVIL,MOTRIN) tablet 400 mg (400 mg Oral Given 02/26/16 1542)     Initial Impression / Assessment and Plan / ED Course  I have reviewed the triage vital signs and the nursing notes.  Pertinent labs & imaging results that were available during my care of the patient were reviewed by me and considered in my medical decision making (see chart for details).  Clinical Course     26 y.o. female presents with inversion of left ankle sustaining low grade sprain. Refusing to ambulate d/t pain. Pt given instructions for supportive care including NSAIDs, rest, ice, compression, and elevation to help alleviate symptoms. Provided crutches. Advised on optimal use of motrin and tylenol for symptomatic control.   Final Clinical Impressions(s) / ED Diagnoses   Final diagnoses:  Sprain of anterior talofibular ligament of left ankle, initial encounter    New Prescriptions Discharge Medication List as of 02/26/2016  3:44 PM    START taking these medications   Details  diclofenac sodium (VOLTAREN) 1 % GEL Apply  4 g topically 4 (four) times daily., Starting Sun 02/26/2016, Print    ibuprofen (ADVIL,MOTRIN) 800 MG tablet Take 1 tablet (800 mg total) by mouth every 6 (six) hours as needed., Starting Sun 02/26/2016, Until Sun 03/04/2016, Print         Lyndal Pulleyaniel Hasan Douse, MD 02/26/16 1730

## 2016-07-31 ENCOUNTER — Emergency Department (HOSPITAL_COMMUNITY): Payer: Self-pay

## 2016-07-31 ENCOUNTER — Emergency Department (HOSPITAL_COMMUNITY)
Admission: EM | Admit: 2016-07-31 | Discharge: 2016-08-01 | Disposition: A | Discharge status: Home / Self Care | Payer: Self-pay | Attending: Emergency Medicine | Admitting: Emergency Medicine

## 2016-07-31 ENCOUNTER — Encounter (HOSPITAL_COMMUNITY): Payer: Self-pay | Admitting: Emergency Medicine

## 2016-07-31 DIAGNOSIS — Z79899 Other long term (current) drug therapy: Secondary | ICD-10-CM | POA: Insufficient documentation

## 2016-07-31 DIAGNOSIS — R109 Unspecified abdominal pain: Secondary | ICD-10-CM

## 2016-07-31 DIAGNOSIS — R1032 Left lower quadrant pain: Secondary | ICD-10-CM | POA: Insufficient documentation

## 2016-07-31 LAB — URINALYSIS, ROUTINE W REFLEX MICROSCOPIC
Bilirubin Urine: NEGATIVE
Glucose, UA: NEGATIVE mg/dL
Hgb urine dipstick: NEGATIVE
Ketones, ur: NEGATIVE mg/dL
Leukocytes, UA: NEGATIVE
Nitrite: NEGATIVE
Protein, ur: NEGATIVE mg/dL
SPECIFIC GRAVITY, URINE: 1.023 (ref 1.005–1.030)
pH: 7 (ref 5.0–8.0)

## 2016-07-31 LAB — COMPREHENSIVE METABOLIC PANEL WITH GFR
ALT: 43 U/L (ref 14–54)
AST: 24 U/L (ref 15–41)
Albumin: 4.4 g/dL (ref 3.5–5.0)
Alkaline Phosphatase: 55 U/L (ref 38–126)
Anion gap: 9 (ref 5–15)
BUN: 17 mg/dL (ref 6–20)
CO2: 24 mmol/L (ref 22–32)
Calcium: 9.3 mg/dL (ref 8.9–10.3)
Chloride: 106 mmol/L (ref 101–111)
Creatinine, Ser: 0.62 mg/dL (ref 0.44–1.00)
GFR calc Af Amer: >60 mL/min
GFR calc non Af Amer: >60 mL/min
Glucose, Bld: 107 mg/dL — ABNORMAL HIGH (ref 65–99)
Potassium: 4 mmol/L (ref 3.5–5.1)
Sodium: 139 mmol/L (ref 135–145)
Total Bilirubin: 0.6 mg/dL (ref 0.3–1.2)
Total Protein: 7.6 g/dL (ref 6.5–8.1)

## 2016-07-31 LAB — CBC
HCT: 45.5 % (ref 36.0–46.0)
HEMOGLOBIN: 14.9 g/dL (ref 12.0–15.0)
MCH: 27.4 pg (ref 26.0–34.0)
MCHC: 32.7 g/dL (ref 30.0–36.0)
MCV: 83.6 fL (ref 78.0–100.0)
Platelets: 177 10*3/uL (ref 150–400)
RBC: 5.44 MIL/uL — AB (ref 3.87–5.11)
RDW: 13.2 % (ref 11.5–15.5)
WBC: 8.8 10*3/uL (ref 4.0–10.5)

## 2016-07-31 LAB — LIPASE, BLOOD: Lipase: 19 U/L (ref 11–51)

## 2016-07-31 LAB — I-STAT BETA HCG BLOOD, ED (MC, WL, AP ONLY): I-stat hCG, quantitative: <5 m[IU]/mL

## 2016-07-31 MED ORDER — FENTANYL CITRATE (PF) 100 MCG/2ML IJ SOLN
50.0000 ug | Freq: Once | INTRAMUSCULAR | Status: AC
  Administered 2016-07-31: 50 ug via INTRAVENOUS
  Filled 2016-07-31: qty 2

## 2016-07-31 MED ORDER — SODIUM CHLORIDE 0.9 % IV BOLUS (SEPSIS)
1000.0000 mL | Freq: Once | INTRAVENOUS | Status: AC
  Administered 2016-07-31: 1000 mL via INTRAVENOUS

## 2016-07-31 MED ORDER — ONDANSETRON HCL 4 MG/2ML IJ SOLN
4.0000 mg | Freq: Once | INTRAMUSCULAR | Status: AC
  Administered 2016-07-31: 4 mg via INTRAVENOUS
  Filled 2016-07-31: qty 2

## 2016-07-31 MED ORDER — TRAMADOL HCL 50 MG PO TABS: 50.0000 mg | ORAL_TABLET | Freq: Four times a day (QID) | ORAL | 0 refills | Status: DC | PRN

## 2016-07-31 MED ORDER — KETOROLAC TROMETHAMINE 30 MG/ML IJ SOLN
30.0000 mg | Freq: Once | INTRAMUSCULAR | Status: AC
  Administered 2016-07-31: 30 mg via INTRAVENOUS
  Filled 2016-07-31: qty 1

## 2016-07-31 NOTE — ED Notes (Signed)
Informed Dr. Hillis RangeBelfa of patients pain level and wanting pain medication.

## 2016-07-31 NOTE — ED Provider Notes (Addendum)
[facility]-EMERGENCY DEPT Provider Note   CSN: [id number] Arrival date & time: 07/31/16  1832     History   Chief Complaint Chief Complaint  Patient presents with  . Abdominal Pain    HPI Regina Nelson is a 26 y.o. female.  Patient is a 26 year old female who presents with left-sided back pain. She states it started yesterday her back and is now radiating to her lower abdomen. It's been getting worse throughout the day. She's been taking some Aleve with some improvement in symptoms. She states it's worse with movement. She has some nausea but no vomiting. She does have normal bowel movements. No urinary symptoms other than she has a little bit of burning on urination. She denies any vaginal bleeding or discharge. She denies any fevers. She denies any injuries to her back.      Past Medical History:  Diagnosis Date  . Degenerative disc disease   . Degenerative joint disease of spine   . Hernia of abdominal wall   . STD (female)     Patient Active Problem List   Diagnosis Date Noted  . Wound, open, abdominal wall, lateral 06/08/2015  . Hernia of abdominal wall 06/08/2015  . 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 Allergy or toxic reaction to venom 06/08/2015  . Edema in pregnancy, postpartum condition 06/07/2015  . Status post primary low transverse cesarean section--FTD 06/02/2015  . Pre-eclampsia in third trimester 06/02/2015  . Pregnancy induced hypertension 05/31/2015  . Morbid obesity with BMI of 40.0-44.9, adult (HCC) 05/20/2015  . Diastasis recti 05/20/2015  . Hx of peptic ulcer 05/20/2015    Past Surgical History:  Procedure Laterality Date  . CESAREAN SECTION N/A 06/01/2015   Procedure: CESAREAN SECTION;  Surgeon: Jaymes Graff, MD;  Location: [facility];  Service: Obstetrics;  Laterality: N/A;  . CHOLECYSTECTOMY    . WISDOM TOOTH EXTRACTION      OB History    Gravida Para Term Preterm AB Living   1 1 1     1    SAB TAB Ectopic  Multiple Live Births         0 1       Home Medications    Prior to Admission medications   Medication Sig Start Date End Date Taking? Authorizing Provider  naproxen sodium (ANAPROX) 220 MG tablet Take 220 mg by mouth 2 (two) times daily as needed (pain).   Yes [provider]  diclofenac sodium (VOLTAREN) 1 % GEL Apply 4 g topically 4 (four) times daily. Patient not taking: Reported on 07/31/2016 02/26/16   Lyndal Pulley, MD  naproxen (NAPROSYN) 500 MG tablet Take 1 tablet (500 mg total) by mouth 2 (two) times daily. Patient not taking: Reported on 07/31/2016 08/26/15   Audry Pili, PA-C  predniSONE (DELTASONE) 20 MG tablet Take 3 tablets (60 mg total) by mouth daily with breakfast. Patient not taking: Reported on 07/31/2016 08/26/15   Audry Pili, PA-C  traMADol (ULTRAM) 50 MG tablet Take 1 tablet (50 mg total) by mouth every 6 (six) hours as needed. 07/31/16   Rolan Bucco, MD    Family History Family History  Problem Relation Age of Onset  . Cervical cancer Maternal Aunt   . Colon cancer Neg Hx     Social History Social History  Substance Use Topics  . Smoking status: Never Smoker  . Smokeless tobacco: Never Used  . Alcohol use No     Allergies   Bee venom and Nitrous oxide  Review of Systems Review of Systems  Constitutional: Negative for chills, diaphoresis, fatigue and fever.  HENT: Negative for congestion, rhinorrhea and sneezing.   Eyes: Negative.   Respiratory: Negative for cough, chest tightness and shortness of breath.   Cardiovascular: Negative for chest pain and leg swelling.  Gastrointestinal: Positive for abdominal pain and nausea. Negative for blood in stool, diarrhea and vomiting.  Genitourinary: Negative for difficulty urinating, flank pain, frequency and hematuria.  Musculoskeletal: Positive for back pain. Negative for arthralgias.  Skin: Negative for rash.  Neurological: Negative for dizziness, speech difficulty, weakness, numbness and  headaches.     Physical Exam Updated Vital Signs BP 115/73 (BP Location: Right Arm)   Pulse 86   Temp 99 F (37.2 C) (Oral)   Resp 20   Ht 5\' 6"  (1.676 m)   Wt 102.1 kg (225 lb)   SpO2 98%   BMI 36.32 kg/m   Physical Exam  Constitutional: She is oriented to person, place, and time. She appears well-developed and well-nourished.  HENT:  Head: Normocephalic and atraumatic.  Eyes: Pupils are equal, round, and reactive to light.  Neck: Normal range of motion. Neck supple.  Cardiovascular: Normal rate, regular rhythm and normal heart sounds.   Pulmonary/Chest: Effort normal and breath sounds normal. No respiratory distress. She has no wheezes. She has no rales. She exhibits no tenderness.  Abdominal: Soft. Bowel sounds are normal. There is tenderness. There is no rebound and no guarding.  Positive tenderness to the left midabdomen and left CVA area  Musculoskeletal: Normal range of motion. She exhibits no edema.  Lymphadenopathy:    She has no cervical adenopathy.  Neurological: She is alert and oriented to person, place, and time.  Skin: Skin is warm and dry. No rash noted.  Psychiatric: She has a normal mood and affect.     ED Treatments / Results  Labs (all labs ordered are listed, but only abnormal results are displayed) Labs Reviewed  COMPREHENSIVE METABOLIC PANEL - Abnormal; Notable for the following:       Result Value   Glucose, Bld 107 (*)    All other components within normal limits  CBC - Abnormal; Notable for the following:    RBC 5.44 (*)    All other components within normal limits  LIPASE, BLOOD  URINALYSIS, ROUTINE W REFLEX MICROSCOPIC  I-STAT BETA HCG BLOOD, ED ([facility], [facility], [facility] ONLY)    EKG  EKG Interpretation None       Radiology Ct Renal Stone Study  Result Date: 07/31/2016 CLINICAL DATA:  Left-sided flank pain EXAM: CT ABDOMEN AND PELVIS WITHOUT CONTRAST TECHNIQUE: Multidetector CT imaging of the abdomen and pelvis was performed following the  standard protocol without IV contrast. COMPARISON:  None. FINDINGS: Lower chest: No acute abnormality. Hepatobiliary: Hepatic steatosis. Surgical clips in the gallbladder fossa. No biliary dilatation Pancreas: Unremarkable. No pancreatic ductal dilatation or surrounding inflammatory changes. Spleen: Normal in size without focal abnormality. Adrenals/Urinary Tract: Adrenal glands are within normal limits. Punctate stones in the upper pole of the left kidney. No hydronephrosis. At least 2 small calcifications in the left pelvis, these appear to be separate from the left ureter and are doubtful for ureteral stones. The bladder is normal. Stomach/Bowel: Stomach is within normal limits. Appendix appears normal. No evidence of bowel wall thickening, distention, or inflammatory changes. Vascular/Lymphatic: No significant vascular findings are present. No enlarged abdominal or pelvic lymph nodes. Reproductive: Uterus and bilateral adnexa are unremarkable. Other: No free air or free fluid.  Small fat in the umbilicus. Musculoskeletal: No acute or significant osseous findings. IMPRESSION: 1. Punctate intrarenal calculi on the left. No significant left hydronephrosis or hydroureter. There are 2 small 2 mm calcifications in the left pelvis which do not appear to be associated with distal left ureter and are therefore doubtful for distal ureteral stones. 2. Hepatic steatosis Electronically Signed   By: Jasmine Pang M.D.   On: 07/31/2016 22:44    Procedures Procedures (including critical care time)  Medications Ordered in ED Medications  ketorolac (TORADOL) 30 MG/ML injection 30 mg (30 mg Intravenous Given 07/31/16 2151)  ondansetron (ZOFRAN) injection 4 mg (4 mg Intravenous Given 07/31/16 2150)  sodium chloride 0.9 % bolus 1,000 mL (0 mLs Intravenous Stopped 07/31/16 2258)  fentaNYL (SUBLIMAZE) injection 50 mcg (50 mcg Intravenous Given 07/31/16 2301)     Initial Impression / Assessment and Plan / ED Course  I have  reviewed the triage vital signs and the nursing notes.  Pertinent labs & imaging results that were available during my care of the patient were reviewed by me and considered in my medical decision making (see chart for details).     Patient presents with left flank pain. She has a history of back problems but typically she doesn't have radiation to her abdomen. In typically it's not that significant. She was initially treated with Toradol which did not improve her symptoms. She was then given a dose of fentanyl. Given her ongoing symptoms, I did do a CT scan to assess for kidney stones. There is a small stone in the kidney but no obstructing stones. Otherwise her CT scan was unremarkable. Her labs are non-concerning. Her pain has been controlled in the ED. I feel that this could be emanating from her back. She has no radiation down her legs or neurologic deficits. No suggestions of cauda equina. She has no lower abdominal pain which would be more suggestive of a pelvic origin. She was discharged home in good condition. She does have a primary care provider that she can follow-up with. She was encouraged to have close follow with her PCP for recheck. Return precautions were given.  Final Clinical Impressions(s) / ED Diagnoses   Final diagnoses:  Left flank pain    New Prescriptions New Prescriptions   TRAMADOL (ULTRAM) 50 MG TABLET    Take 1 tablet (50 mg total) by mouth every 6 (six) hours as needed.     Rolan Bucco, MD 07/31/16 2340    Rolan Bucco, MD 07/31/16 2340

## 2016-07-31 NOTE — ED Triage Notes (Signed)
Patient c/o lower left sided flank pain radiating to abdomen for past few days. Pt states worsening pain this afternoon. Diarrhea onset of today. Burning sensation with urination. Pt took aleve at home, no meds today.

## 2016-07-31 NOTE — ED Notes (Signed)
Pt in CT.

## 2017-01-08 ENCOUNTER — Emergency Department (HOSPITAL_COMMUNITY)
Admission: EM | Admit: 2017-01-08 | Discharge: 2017-01-08 | Disposition: A | Discharge status: Home / Self Care | Payer: Self-pay | Attending: Emergency Medicine | Admitting: Emergency Medicine

## 2017-01-08 ENCOUNTER — Encounter (HOSPITAL_COMMUNITY): Payer: Self-pay | Admitting: Emergency Medicine

## 2017-01-08 DIAGNOSIS — Z79899 Other long term (current) drug therapy: Secondary | ICD-10-CM | POA: Insufficient documentation

## 2017-01-08 DIAGNOSIS — M5442 Lumbago with sciatica, left side: Secondary | ICD-10-CM | POA: Insufficient documentation

## 2017-01-08 MED ORDER — OXYCODONE-ACETAMINOPHEN 5-325 MG PO TABS
1.0000 | ORAL_TABLET | ORAL | Status: DC | PRN
  Administered 2017-01-08: 1 via ORAL
  Filled 2017-01-08: qty 1

## 2017-01-08 MED ORDER — ONDANSETRON 4 MG PO TBDP
4.0000 mg | ORAL_TABLET | Freq: Once | ORAL | Status: AC | PRN
  Administered 2017-01-08: 4 mg via ORAL
  Filled 2017-01-08: qty 1

## 2017-01-08 MED ORDER — CYCLOBENZAPRINE HCL 10 MG PO TABS: 10.0000 mg | ORAL_TABLET | Freq: Two times a day (BID) | ORAL | 0 refills | Status: DC | PRN

## 2017-01-08 MED ORDER — PREDNISONE 10 MG (21) PO TBPK: ORAL_TABLET | Freq: Every day | ORAL | 0 refills | Status: DC

## 2017-01-08 MED ORDER — DIAZEPAM 5 MG PO TABS
5.0000 mg | ORAL_TABLET | Freq: Once | ORAL | Status: AC
  Administered 2017-01-08: 5 mg via ORAL
  Filled 2017-01-08: qty 1

## 2017-01-08 MED ORDER — HYDROCODONE-ACETAMINOPHEN 5-325 MG PO TABS: 2.0000 | ORAL_TABLET | ORAL | 0 refills | Status: DC | PRN

## 2017-01-08 MED ORDER — NAPROXEN 500 MG PO TABS: 500.0000 mg | ORAL_TABLET | Freq: Two times a day (BID) | ORAL | 0 refills | Status: DC

## 2017-01-08 MED ORDER — OXYCODONE-ACETAMINOPHEN 5-325 MG PO TABS
1.0000 | ORAL_TABLET | Freq: Once | ORAL | Status: AC
  Administered 2017-01-08: 1 via ORAL
  Filled 2017-01-08: qty 1

## 2017-01-08 NOTE — ED Notes (Signed)
ED Provider at bedside. 

## 2017-01-08 NOTE — ED Triage Notes (Signed)
Pt to ER for evaluation of left lateral back pain radiating down left leg, reports numbness to left foot with pain. Pt ambulatory, with pain. A/o x4. VSS

## 2017-01-08 NOTE — ED Provider Notes (Signed)
[facility] EMERGENCY DEPARTMENT Provider Note   CSN: [id number] Arrival date & time: 01/08/17  0805     History   Chief Complaint Chief Complaint  Patient presents with  . Back Pain    HPI Regina Nelson is a 26 y.o. female.  HPI  Patient presenting with complaint of left lower back pain.  She states the pain began yesterday while at work.  The pain radiates into her buttock and down her left thigh.  She also complains of some numbness in her left foot.  She has not had any trauma or falls.  No fever or chills.  Yesterday she tried ibuprofen and naproxen and Tylenol and an ice pack without much relief.  She states she was diagnosed with degenerative disc disease as a teenager.  She has no urinary retention or incontinence of bowel or bladder.  No weakness of lower extremities.There are no other associated systemic symptoms, there are no other alleviating or modifying factors.   Past Medical History:  Diagnosis Date  . Degenerative disc disease   . Degenerative joint disease of spine   . Hernia of abdominal wall   . STD (female)     Patient Active Problem List   Diagnosis Date Noted  . Wound, open, abdominal wall, lateral 06/08/2015  . Hernia of abdominal wall 06/08/2015  . 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 Allergy or toxic reaction to venom 06/08/2015  . Edema in pregnancy, postpartum condition 06/07/2015  . Status post primary low transverse cesarean section--FTD 06/02/2015  . Pre-eclampsia in third trimester 06/02/2015  . Pregnancy induced hypertension 05/31/2015  . Morbid obesity with BMI of 40.0-44.9, adult (HCC) 05/20/2015  . Diastasis recti 05/20/2015  . Hx of peptic ulcer 05/20/2015    Past Surgical History:  Procedure Laterality Date  . CESAREAN SECTION N/A 06/01/2015   Procedure: CESAREAN SECTION;  Surgeon: Jaymes Graff, MD;  Location: [facility];  Service: Obstetrics;  Laterality: N/A;  .  CHOLECYSTECTOMY    . WISDOM TOOTH EXTRACTION      OB History    Gravida Para Term Preterm AB Living   1 1 1     1    SAB TAB Ectopic Multiple Live Births         0 1       Home Medications    Prior to Admission medications   Medication Sig Start Date End Date Taking? Authorizing Provider  cyclobenzaprine (FLEXERIL) 10 MG tablet Take 1 tablet (10 mg total) by mouth 2 (two) times daily as needed for muscle spasms. 01/08/17   Jahquez Steffler, Latanya Maudlin, MD  diclofenac sodium (VOLTAREN) 1 % GEL Apply 4 g topically 4 (four) times daily. Patient not taking: Reported on 07/31/2016 02/26/16   Lyndal Pulley, MD  HYDROcodone-acetaminophen (NORCO/VICODIN) 5-325 MG tablet Take 2 tablets by mouth every 4 (four) hours as needed. 01/08/17   Phillis Haggis, MD  naproxen (NAPROSYN) 500 MG tablet Take 1 tablet (500 mg total) by mouth 2 (two) times daily. 01/08/17   Phillis Haggis, MD  naproxen sodium (ANAPROX) 220 MG tablet Take 220 mg by mouth 2 (two) times daily as needed (pain).    [provider]  predniSONE (STERAPRED UNI-PAK 21 TAB) 10 MG (21) TBPK tablet Take by mouth daily. Take 6 tabs by mouth daily  for 2 days, then 5 tabs for 2 days, then 4 tabs for 2 days, then 3 tabs for 2 days, 2 tabs for 2 days,  then 1 tab by mouth daily for 2 days 01/08/17   Phillis HaggisMabe, Dell Hurtubise L, MD  traMADol (ULTRAM) 50 MG tablet Take 1 tablet (50 mg total) by mouth every 6 (six) hours as needed. 07/31/16   Rolan BuccoBelfi, Melanie, MD    Family History Family History  Problem Relation Age of Onset  . Cervical cancer Maternal Aunt   . Colon cancer Neg Hx     Social History Social History   Tobacco Use  . Smoking status: Never Smoker  . Smokeless tobacco: Never Used  Substance Use Topics  . Alcohol use: No  . Drug use: No     Allergies   Bee venom and Nitrous oxide   Review of Systems Review of Systems  ROS reviewed and all otherwise negative except for mentioned in HPI   Physical Exam Updated Vital Signs BP 95/70 (BP  Location: Left Arm)   Pulse 74   Temp 98.1 F (36.7 C) (Oral)   Resp 18   LMP 12/03/2016   SpO2 97%  Vitals reviewed Physical Exam  Physical Examination: General appearance - alert, well appearing, and in no distress Mental status - alert, oriented to person, place, and time Eyes - no conjunctival injection, no scleral icterus Chest - clear to auscultation, no wheezes, rales or rhonchi, symmetric air entry Heart - normal rate, regular rhythm, normal S1, S2, no murmurs, rubs, clicks or gallops Back exam - no midline tenderness, ttp of left lumbar paraspinal region, no CVA tenderness Neurological - alert, orientedx  3, strength 5/5 in extremities x 4, sensation intact Musculoskeletal - no joint tenderness, deformity or swelling Extremities - peripheral pulses normal, no pedal edema, no clubbing or cyanosis Skin - normal coloration and turgor, no rashes   ED Treatments / Results  Labs (all labs ordered are listed, but only abnormal results are displayed) Labs Reviewed - No data to display  EKG  EKG Interpretation None       Radiology No results found.  Procedures Procedures (including critical care time)  Medications Ordered in ED Medications  oxyCODONE-acetaminophen (PERCOCET/ROXICET) 5-325 MG per tablet 1 tablet (1 tablet Oral Given 01/08/17 0919)  ondansetron (ZOFRAN-ODT) disintegrating tablet 4 mg (4 mg Oral Given 01/08/17 1041)  oxyCODONE-acetaminophen (PERCOCET/ROXICET) 5-325 MG per tablet 1 tablet (1 tablet Oral Given 01/08/17 1301)  diazepam (VALIUM) tablet 5 mg (5 mg Oral Given 01/08/17 1301)     Initial Impression / Assessment and Plan / ED Course  I have reviewed the triage vital signs and the nursing notes.  Pertinent labs & imaging results that were available during my care of the patient were reviewed by me and considered in my medical decision making (see chart for details).    Patient presenting with left lower back pain.  Symptoms began yesterday  without any associated trauma.  She has a normal neurologic exam.  No signs or symptoms of cauda equina, no fever to suggest epidural abcess.  Pt treated with pain meds, muscle relaxers, anti-inflammatories.  Discharged with strict return precautions.  Pt agreeable with plan.   Final Clinical Impressions(s) / ED Diagnoses   Final diagnoses:  Acute left-sided low back pain with left-sided sciatica    ED Discharge Orders        Ordered    cyclobenzaprine (FLEXERIL) 10 MG tablet  2 times daily PRN     01/08/17 1320    naproxen (NAPROSYN) 500 MG tablet  2 times daily     01/08/17 1320    HYDROcodone-acetaminophen (NORCO/VICODIN) 5-325  MG tablet  Every 4 hours PRN     01/08/17 1320    predniSONE (STERAPRED UNI-PAK 21 TAB) 10 MG (21) TBPK tablet  Daily     01/08/17 1320       Phillis HaggisMabe, Lashay Osborne L, MD 01/08/17 1456

## 2017-01-08 NOTE — Discharge Instructions (Signed)
Return to the ED with any concerns including weakness of legs, not able to urinate, loss of control of bowel or bladder, decreased level of alertness/lethargy, or any other alarming symptoms °

## 2017-03-05 ENCOUNTER — Ambulatory Visit (HOSPITAL_COMMUNITY)
Admission: EM | Admit: 2017-03-05 | Discharge: 2017-03-05 | Disposition: A | Discharge status: Home / Self Care | Payer: Self-pay | Attending: Family Medicine | Admitting: Family Medicine

## 2017-03-05 ENCOUNTER — Encounter (HOSPITAL_COMMUNITY): Payer: Self-pay | Admitting: Family Medicine

## 2017-03-05 DIAGNOSIS — J029 Acute pharyngitis, unspecified: Secondary | ICD-10-CM

## 2017-03-05 DIAGNOSIS — J020 Streptococcal pharyngitis: Secondary | ICD-10-CM

## 2017-03-05 LAB — POCT RAPID STREP A: STREPTOCOCCUS, GROUP A SCREEN (DIRECT): POSITIVE — AB

## 2017-03-05 MED ORDER — AMOXICILLIN 875 MG PO TABS: 875.0000 mg | ORAL_TABLET | Freq: Two times a day (BID) | ORAL | 0 refills | Status: AC

## 2017-03-05 NOTE — ED Triage Notes (Signed)
Pt here for sore throat and fever since this am. Reports that she took tylenol.

## 2017-03-11 NOTE — ED Provider Notes (Signed)
  [facility]   [id number] 03/05/17 Arrival Time: 1918  ASSESSMENT & PLAN:  1. Sore throat   2. Strep pharyngitis     Meds ordered this encounter  Medications  . amoxicillin (AMOXIL) 875 MG tablet    Sig: Take 1 tablet (875 mg total) by mouth 2 (two) times daily for 10 days.    Dispense:  20 tablet    Refill:  0   Labs Reviewed  POCT RAPID STREP A - Abnormal; Notable for the following components:      Result Value   Streptococcus, Group A Screen (Direct) POSITIVE (*)    All other components within normal limits   OTC analgesics and throat care as needed  Instructed to finish full 10 day course of antibiotics. Will follow up if not showing significant improvement over the next 24-48 hours.  Reviewed expectations re: course of current medical issues. Questions answered. Outlined signs and symptoms indicating need for more acute intervention. Patient verbalized understanding. After Visit Summary given.   SUBJECTIVE:  Regina Nelson is a 27 y.o. female who reports a sore throat. Describes as sharp pain with swallowing. Onset abrupt beginning this morning. No respiratory symptoms. Normal PO intake but reports discomfort with swallowing. Fever reported: yes, subjective. No associated n/v/abdominal symptoms. Sick contacts: None.  OTC treatment: Tylenol with mild help..  ROS: As per HPI.   OBJECTIVE:  Vitals:   03/05/17 1943  BP: (!) 141/77  Pulse: (!) 108  Resp: 18  Temp: 98.5 F (36.9 C)  SpO2: 100%    General appearance: alert; no distress HEENT: throat with tonsillar hypertrophy and moderate erythema Neck: supple with FROM; bilateral cervical LAD, tender Lungs: clear to auscultation bilaterally Skin: reveals no rash; warm and dry Psychological: alert and cooperative; normal mood and affect  Allergies  Allergen Reactions  . Bee Venom Anaphylaxis  . Nitrous Oxide Shortness Of Breath    Past Medical History:  Diagnosis Date  .  Degenerative disc disease   . Degenerative joint disease of spine   . Hernia of abdominal wall   . STD (female)    Social History   Socioeconomic History  . Marital status: Single    Spouse name: Not on file  . Number of children: 0  . Years of education: Not on file  . Highest education level: Not on file  Social Needs  . Financial resource strain: Not on file  . Food insecurity - worry: Not on file  . Food insecurity - inability: Not on file  . Transportation needs - medical: Not on file  . Transportation needs - non-medical: Not on file  Occupational History  . Occupation: Insurance account managerMedical Records Tech   Tobacco Use  . Smoking status: Never Smoker  . Smokeless tobacco: Never Used  Substance and Sexual Activity  . Alcohol use: No  . Drug use: No  . Sexual activity: Not on file  Other Topics Concern  . Not on file  Social History Narrative   Daily caffeine    Family History  Problem Relation Age of Onset  . Cervical cancer Maternal Aunt   . Colon cancer Neg Hx           Mardella LaymanHagler, Regina Jurek, MD 03/11/17 [phone]

## 2017-04-15 ENCOUNTER — Encounter (HOSPITAL_BASED_OUTPATIENT_CLINIC_OR_DEPARTMENT_OTHER): Payer: Self-pay | Admitting: *Deleted

## 2017-04-15 ENCOUNTER — Emergency Department (HOSPITAL_BASED_OUTPATIENT_CLINIC_OR_DEPARTMENT_OTHER)
Admission: EM | Admit: 2017-04-15 | Discharge: 2017-04-15 | Disposition: A | Discharge status: Home / Self Care | Payer: BLUE CROSS/BLUE SHIELD | Attending: Physician Assistant | Admitting: Physician Assistant

## 2017-04-15 ENCOUNTER — Other Ambulatory Visit: Payer: Self-pay

## 2017-04-15 DIAGNOSIS — R05 Cough: Secondary | ICD-10-CM | POA: Diagnosis present

## 2017-04-15 DIAGNOSIS — Z9049 Acquired absence of other specified parts of digestive tract: Secondary | ICD-10-CM | POA: Insufficient documentation

## 2017-04-15 DIAGNOSIS — J111 Influenza due to unidentified influenza virus with other respiratory manifestations: Secondary | ICD-10-CM

## 2017-04-15 DIAGNOSIS — Z79899 Other long term (current) drug therapy: Secondary | ICD-10-CM | POA: Insufficient documentation

## 2017-04-15 MED ORDER — OSELTAMIVIR PHOSPHATE 75 MG PO CAPS: 75.0000 mg | ORAL_CAPSULE | Freq: Two times a day (BID) | ORAL | 0 refills | Status: DC

## 2017-04-15 MED ORDER — IBUPROFEN 800 MG PO TABS: 800.0000 mg | ORAL_TABLET | Freq: Three times a day (TID) | ORAL | 0 refills | Status: AC

## 2017-04-15 NOTE — Discharge Instructions (Signed)
Please take Tylenol and ibuprofen to help with your symptoms.  You can take the Tamiflu if it is affordable to you it may help decrease the number of days of your symptoms.  Be sure to drink plenty of fluids and return to follow-up if needed.

## 2017-04-15 NOTE — ED Triage Notes (Signed)
Lower back pain, headache, cough and sore throat x 3 days.

## 2017-04-15 NOTE — ED Provider Notes (Signed)
[facility] EMERGENCY DEPARTMENT Provider Note   CSN: [id number] Arrival date & time: 04/15/17  1202     History   Chief Complaint Chief Complaint  Patient presents with  . Cough  . Sore Throat    HPI Regina Nelson is a 27 y.o. female.  HPI   Patient is a 27 year old female sent here with flulike illness.  Patient has dry cough, body aches, feels like she "was hit by a truck".  Patient feels fatigued.  Has not noted any fever but has not take any at home.  Patient has a toddler at home.   Past Medical History:  Diagnosis Date  . Degenerative disc disease   . Degenerative joint disease of spine   . Hernia of abdominal wall   . STD (female)     Patient Active Problem List   Diagnosis Date Noted  . Wound, open, abdominal wall, lateral 06/08/2015  . Hernia of abdominal wall 06/08/2015  . 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 Allergy or toxic reaction to venom 06/08/2015  . Edema in pregnancy, postpartum condition 06/07/2015  . Status post primary low transverse cesarean section--FTD 06/02/2015  . Pre-eclampsia in third trimester 06/02/2015  . Pregnancy induced hypertension 05/31/2015  . Morbid obesity with BMI of 40.0-44.9, adult (HCC) 05/20/2015  . Diastasis recti 05/20/2015  . Hx of peptic ulcer 05/20/2015    Past Surgical History:  Procedure Laterality Date  . CESAREAN SECTION N/A 06/01/2015   Procedure: CESAREAN SECTION;  Surgeon: Jaymes Graff, MD;  Location: [facility];  Service: Obstetrics;  Laterality: N/A;  . CHOLECYSTECTOMY    . WISDOM TOOTH EXTRACTION      OB History    Gravida Para Term Preterm AB Living   1 1 1     1    SAB TAB Ectopic Multiple Live Births         0 1       Home Medications    Prior to Admission medications   Medication Sig Start Date End Date Taking? Authorizing Provider  cyclobenzaprine (FLEXERIL) 10 MG tablet Take 1 tablet (10 mg total) by mouth 2 (two) times daily as needed for  muscle spasms. 01/08/17   Mabe, Latanya Maudlin, MD  diclofenac sodium (VOLTAREN) 1 % GEL Apply 4 g topically 4 (four) times daily. Patient not taking: Reported on 07/31/2016 02/26/16   Lyndal Pulley, MD  HYDROcodone-acetaminophen (NORCO/VICODIN) 5-325 MG tablet Take 2 tablets by mouth every 4 (four) hours as needed. 01/08/17   Mabe, Latanya Maudlin, MD  ibuprofen (ADVIL,MOTRIN) 800 MG tablet Take 1 tablet (800 mg total) by mouth 3 (three) times daily. 04/15/17   Wynema Garoutte Lyn, MD  naproxen (NAPROSYN) 500 MG tablet Take 1 tablet (500 mg total) by mouth 2 (two) times daily. 01/08/17   Phillis Haggis, MD  naproxen sodium (ANAPROX) 220 MG tablet Take 220 mg by mouth 2 (two) times daily as needed (pain).    [provider]  oseltamivir (TAMIFLU) 75 MG capsule Take 1 capsule (75 mg total) by mouth every 12 (twelve) hours. 04/15/17   Haya Hemler Lyn, MD  predniSONE (STERAPRED UNI-PAK 21 TAB) 10 MG (21) TBPK tablet Take by mouth daily. Take 6 tabs by mouth daily  for 2 days, then 5 tabs for 2 days, then 4 tabs for 2 days, then 3 tabs for 2 days, 2 tabs for 2 days, then 1 tab by mouth daily for 2 days 01/08/17   Phillis Haggis,  MD  traMADol (ULTRAM) 50 MG tablet Take 1 tablet (50 mg total) by mouth every 6 (six) hours as needed. 07/31/16   Rolan BuccoBelfi, Melanie, MD    Family History Family History  Problem Relation Age of Onset  . Cervical cancer Maternal Aunt   . Colon cancer Neg Hx     Social History Social History   Tobacco Use  . Smoking status: Never Smoker  . Smokeless tobacco: Never Used  Substance Use Topics  . Alcohol use: No  . Drug use: No     Allergies   Bee venom and Nitrous oxide   Review of Systems Review of Systems  Constitutional: Positive for fatigue and fever. Negative for activity change.  HENT: Positive for congestion.   Respiratory: Positive for cough. Negative for shortness of breath.   Cardiovascular: Negative for chest pain.  Gastrointestinal: Negative for  abdominal pain.  Musculoskeletal: Positive for arthralgias and myalgias.  All other systems reviewed and are negative.    Physical Exam Updated Vital Signs BP 128/80   Pulse 96   Temp 98.2 F (36.8 C) (Oral)   Resp 20   Ht 5\' 6"  (1.676 m)   Wt 102.1 kg (225 lb)   SpO2 99%   BMI 36.32 kg/m   Physical Exam  Constitutional: She is oriented to person, place, and time. She appears well-developed and well-nourished.  HENT:  Head: Normocephalic and atraumatic.  Mouth/Throat: Uvula is midline, oropharynx is clear and moist and mucous membranes are normal.  Eyes: EOM are normal. Pupils are equal, round, and reactive to light. Right eye exhibits no discharge.  Neck: Normal range of motion. Neck supple.  Cardiovascular: Normal rate, regular rhythm and normal heart sounds.  No murmur heard. Pulmonary/Chest: Effort normal and breath sounds normal. She has no wheezes. She has no rales.  Abdominal: Soft. She exhibits no distension. There is no tenderness.  Neurological: She is oriented to person, place, and time.  Skin: Skin is warm and dry. She is not diaphoretic.  Psychiatric: She has a normal mood and affect.  Nursing note and vitals reviewed.    ED Treatments / Results  Labs (all labs ordered are listed, but only abnormal results are displayed) Labs Reviewed - No data to display  EKG  EKG Interpretation None       Radiology No results found.  Procedures Procedures (including critical care time)  Medications Ordered in ED Medications - No data to display   Initial Impression / Assessment and Plan / ED Course  I have reviewed the triage vital signs and the nursing notes.  Pertinent labs & imaging results that were available during my care of the patient were reviewed by me and considered in my medical decision making (see chart for details).     Patient is a 27 year old female sent here with flulike illness.  Patient has dry cough, body aches, feels like she "was  hit by a truck".  Patient feels fatigued.  Has not noted any fever but has not take any at home.  Patient has a toddler at home.   2:12 PM Normal vital signs.  Lung sounds normal.  Patient eating and drinking normally.  Will treat as flu.  We will have her follow-up with primary care as needed.  Final Clinical Impressions(s) / ED Diagnoses   Final diagnoses:  Influenza    ED Discharge Orders        Ordered    oseltamivir (TAMIFLU) 75 MG capsule  Every 12 hours  04/15/17 1411    ibuprofen (ADVIL,MOTRIN) 800 MG tablet  3 times daily     04/15/17 1411       Jamari Diana, Cindee Salt, MD 04/15/17 1413

## 2017-09-04 ENCOUNTER — Encounter (HOSPITAL_BASED_OUTPATIENT_CLINIC_OR_DEPARTMENT_OTHER): Payer: Self-pay | Admitting: Emergency Medicine

## 2017-09-04 ENCOUNTER — Other Ambulatory Visit: Payer: Self-pay

## 2017-09-04 ENCOUNTER — Emergency Department (HOSPITAL_BASED_OUTPATIENT_CLINIC_OR_DEPARTMENT_OTHER)
Admission: EM | Admit: 2017-09-04 | Discharge: 2017-09-04 | Disposition: A | Discharge status: Home / Self Care | Payer: BLUE CROSS/BLUE SHIELD | Attending: Emergency Medicine | Admitting: Emergency Medicine

## 2017-09-04 DIAGNOSIS — H5789 Other specified disorders of eye and adnexa: Secondary | ICD-10-CM | POA: Diagnosis present

## 2017-09-04 DIAGNOSIS — H1032 Unspecified acute conjunctivitis, left eye: Secondary | ICD-10-CM | POA: Diagnosis not present

## 2017-09-04 DIAGNOSIS — Z9049 Acquired absence of other specified parts of digestive tract: Secondary | ICD-10-CM | POA: Diagnosis not present

## 2017-09-04 MED ORDER — ERYTHROMYCIN 5 MG/GM OP OINT: TOPICAL_OINTMENT | Freq: Four times a day (QID) | OPHTHALMIC | 0 refills | Status: DC

## 2017-09-04 NOTE — ED Triage Notes (Signed)
Left eye swelling, drainage and irritation since yesterday.  Pt states her son had same last week.

## 2017-09-04 NOTE — ED Provider Notes (Signed)
[facility] EMERGENCY DEPARTMENT Provider Note   CSN: [id number] Arrival date & time: 09/04/17  1018     History   Chief Complaint Chief Complaint  Patient presents with  . Conjunctivitis    HPI Regina Nelson is a 27 y.o. female.  HPI 27 year old female who presents complaining of left eye crusting and discharge concerning for conjunctivitis. No fevers.  Recent member of the household with conjunctivitis.  No other complaints.  Here with another patient with similar complaints who lives in the household.  Symptoms present for 2 days.  Nothing worsens or improves symptoms   Past Medical History:  Diagnosis Date  . Degenerative disc disease   . Degenerative joint disease of spine   . Hernia of abdominal wall   . STD (female)     Patient Active Problem List   Diagnosis Date Noted  . Wound, open, abdominal wall, lateral 06/08/2015  . Hernia of abdominal wall 06/08/2015  . 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 Allergy or toxic reaction to venom 06/08/2015  . Edema in pregnancy, postpartum condition 06/07/2015  . Status post primary low transverse cesarean section--FTD 06/02/2015  . Pre-eclampsia in third trimester 06/02/2015  . Pregnancy induced hypertension 05/31/2015  . Morbid obesity with BMI of 40.0-44.9, adult (HCC) 05/20/2015  . Diastasis recti 05/20/2015  . Hx of peptic ulcer 05/20/2015    Past Surgical History:  Procedure Laterality Date  . CESAREAN SECTION N/A 06/01/2015   Procedure: CESAREAN SECTION;  Surgeon: Jaymes Graff, MD;  Location: [facility];  Service: Obstetrics;  Laterality: N/A;  . CHOLECYSTECTOMY    . WISDOM TOOTH EXTRACTION       OB History    Gravida  1   Para  1   Term  1   Preterm      AB      Living  1     SAB      TAB      Ectopic      Multiple  0   Live Births  1            Home Medications    Prior to Admission medications   Medication Sig Start Date End Date  Taking? Authorizing Provider  erythromycin ophthalmic ointment Place into the left eye 4 (four) times daily. Place a 1/2 inch ribbon of ointment into the lower eyelid. 09/04/17   Azalia Bilis, MD  ibuprofen (ADVIL,MOTRIN) 800 MG tablet Take 1 tablet (800 mg total) by mouth 3 (three) times daily. 04/15/17   Mackuen, Cindee Salt, MD    Family History Family History  Problem Relation Age of Onset  . Cervical cancer Maternal Aunt   . Colon cancer Neg Hx     Social History Social History   Tobacco Use  . Smoking status: Never Smoker  . Smokeless tobacco: Never Used  Substance Use Topics  . Alcohol use: No  . Drug use: No     Allergies   Bee venom and Nitrous oxide   Review of Systems Review of Systems  All other systems reviewed and are negative.    Physical Exam Updated Vital Signs BP 113/78 (BP Location: Right Arm)   Pulse 70   Temp 98.3 F (36.8 C) (Oral)   Resp 18   Ht 5\' 7"  (1.702 m)   Wt 117.9 kg (260 lb)   LMP 08/14/2017   SpO2 98%   BMI 40.72 kg/m   Physical Exam  Constitutional: She is oriented  to person, place, and time. She appears well-developed and well-nourished.  HENT:  Head: Normocephalic.  Eyes:  Extra ocular movements left eye are normal.  Mild periorbital edema without erythema or warmth.  Injection of the left conjunctiva.  Pupils normal  Neck: Normal range of motion.  Pulmonary/Chest: Effort normal.  Abdominal: She exhibits no distension.  Musculoskeletal: Normal range of motion.  Neurological: She is alert and oriented to person, place, and time.  Psychiatric: She has a normal mood and affect.  Nursing note and vitals reviewed.    ED Treatments / Results  Labs (all labs ordered are listed, but only abnormal results are displayed) Labs Reviewed - No data to display  EKG None  Radiology No results found.  Procedures Procedures (including critical care time)  Medications Ordered in ED Medications - No data to  display   Initial Impression / Assessment and Plan / ED Course  I have reviewed the triage vital signs and the nursing notes.  Pertinent labs & imaging results that were available during my care of the patient were reviewed by me and considered in my medical decision making (see chart for details).     Acute conjunctivitis.  Final Clinical Impressions(s) / ED Diagnoses   Final diagnoses:  Acute conjunctivitis of left eye, unspecified acute conjunctivitis type    ED Discharge Orders        Ordered    erythromycin ophthalmic ointment  4 times daily     09/04/17 1051       Azalia Bilisampos, Candler Ginsberg, MD 09/04/17 1058

## 2017-11-26 ENCOUNTER — Encounter (HOSPITAL_COMMUNITY): Payer: Self-pay

## 2017-11-26 ENCOUNTER — Other Ambulatory Visit: Payer: Self-pay

## 2017-11-26 ENCOUNTER — Emergency Department (HOSPITAL_COMMUNITY): Payer: BLUE CROSS/BLUE SHIELD

## 2017-11-26 ENCOUNTER — Emergency Department (HOSPITAL_COMMUNITY)
Admission: EM | Admit: 2017-11-26 | Discharge: 2017-11-26 | Disposition: A | Discharge status: Home / Self Care | Payer: BLUE CROSS/BLUE SHIELD | Attending: Emergency Medicine | Admitting: Emergency Medicine

## 2017-11-26 DIAGNOSIS — M5136 Other intervertebral disc degeneration, lumbar region: Secondary | ICD-10-CM | POA: Diagnosis not present

## 2017-11-26 DIAGNOSIS — M5432 Sciatica, left side: Secondary | ICD-10-CM | POA: Diagnosis not present

## 2017-11-26 DIAGNOSIS — M5431 Sciatica, right side: Secondary | ICD-10-CM | POA: Diagnosis not present

## 2017-11-26 DIAGNOSIS — M545 Low back pain: Secondary | ICD-10-CM | POA: Diagnosis present

## 2017-11-26 HISTORY — DX: Other intervertebral disc degeneration, lumbar region: M51.36

## 2017-11-26 HISTORY — DX: Other intervertebral disc degeneration, lumbar region without mention of lumbar back pain or lower extremity pain: M51.369

## 2017-11-26 LAB — POC URINE PREG, ED: Preg Test, Ur: NEGATIVE

## 2017-11-26 MED ORDER — TRAMADOL HCL 50 MG PO TABS: 50.0000 mg | ORAL_TABLET | Freq: Four times a day (QID) | ORAL | 0 refills | Status: DC | PRN

## 2017-11-26 MED ORDER — LIDOCAINE 5 % EX PTCH
1.0000 | MEDICATED_PATCH | CUTANEOUS | Status: DC
  Administered 2017-11-26: 1 via TRANSDERMAL
  Filled 2017-11-26: qty 1

## 2017-11-26 MED ORDER — OXYCODONE-ACETAMINOPHEN 5-325 MG PO TABS
1.0000 | ORAL_TABLET | Freq: Once | ORAL | Status: AC
  Administered 2017-11-26: 1 via ORAL
  Filled 2017-11-26: qty 1

## 2017-11-26 MED ORDER — PREDNISONE 10 MG (21) PO TBPK: ORAL_TABLET | Freq: Every day | ORAL | 0 refills | Status: DC

## 2017-11-26 MED ORDER — CYCLOBENZAPRINE HCL 10 MG PO TABS: 10.0000 mg | ORAL_TABLET | Freq: Two times a day (BID) | ORAL | 0 refills | Status: DC | PRN

## 2017-11-26 MED ORDER — ONDANSETRON 4 MG PO TBDP: 4.0000 mg | ORAL_TABLET | Freq: Once | ORAL | Status: DC

## 2017-11-26 MED ORDER — ONDANSETRON 4 MG PO TBDP
4.0000 mg | ORAL_TABLET | Freq: Once | ORAL | Status: AC
  Administered 2017-11-26: 4 mg via ORAL
  Filled 2017-11-26: qty 1

## 2017-11-26 NOTE — ED Triage Notes (Signed)
Patient reports that she was diagnosed with back pain/sciatica 2 months ago. Patient states she has had increased pain bilateral lower back pain with pain radiating down both legs x 2 days.

## 2017-11-26 NOTE — ED Provider Notes (Signed)
[facility]-EMERGENCY DEPT Provider Note   CSN: [id number] Arrival date & time: 11/26/17  1536     History   Chief Complaint Chief Complaint  Patient presents with  . Back Pain    HPI Regina Nelson is a 27 y.o. female with a past medical history of DDD, who presents to ED for evaluation of 1 week history of lower back pain.  States that the pain is worse with movement, palpation and walking.  States that she sits for long periods of time at work where "my butt gets numb."  States that she had a history of sciatica and this feels like a similar flareup.  She was told at the age of 57 that she needed "surgery on my back for something."  Patient states that she did not want to have the surgery and has not followed up with a neurosurgeon or spine specialist since.  She has been taking NSAIDs and Tylenol with no improvement in her symptoms.  States that the pain is sharp and radiates down both of her legs.  Denies any loss of bowel or bladder function, dysuria, hematuria, fever, possibility of pregnancy, injuries or falls, numbness in arms or legs, history of cancer, history of IV drug use.  HPI  Past Medical History:  Diagnosis Date  . DDD (degenerative disc disease), lumbar   . Degenerative disc disease   . Degenerative joint disease of spine   . Hernia of abdominal wall   . STD (female)     Patient Active Problem List   Diagnosis Date Noted  . Wound, open, abdominal wall, lateral 06/08/2015  . Hernia of abdominal wall 06/08/2015  . 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 Allergy or toxic reaction to venom 06/08/2015  . Edema in pregnancy, postpartum condition 06/07/2015  . Status post primary low transverse cesarean section--FTD 06/02/2015  . Pre-eclampsia in third trimester 06/02/2015  . Pregnancy induced hypertension 05/31/2015  . Morbid obesity with BMI of 40.0-44.9, adult (HCC) 05/20/2015  . Diastasis recti 05/20/2015    . Hx of peptic ulcer 05/20/2015    Past Surgical History:  Procedure Laterality Date  . CESAREAN SECTION N/A 06/01/2015   Procedure: CESAREAN SECTION;  Surgeon: Jaymes Graff, MD;  Location: [facility];  Service: Obstetrics;  Laterality: N/A;  . CHOLECYSTECTOMY    . WISDOM TOOTH EXTRACTION       OB History    Gravida  1   Para  1   Term  1   Preterm      AB      Living  1     SAB      TAB      Ectopic      Multiple  0   Live Births  1            Home Medications    Prior to Admission medications   Medication Sig Start Date End Date Taking? Authorizing Provider  cyclobenzaprine (FLEXERIL) 10 MG tablet Take 1 tablet (10 mg total) by mouth 2 (two) times daily as needed for muscle spasms. 11/26/17   Goerge Mohr, PA-C  erythromycin ophthalmic ointment Place into the left eye 4 (four) times daily. Place a 1/2 inch ribbon of ointment into the lower eyelid. 09/04/17   Azalia Bilis, MD  ibuprofen (ADVIL,MOTRIN) 800 MG tablet Take 1 tablet (800 mg total) by mouth 3 (three) times daily. 04/15/17   Mackuen, Courteney Lyn, MD  predniSONE (STERAPRED UNI-PAK 21 TAB)  10 MG (21) TBPK tablet Take by mouth daily. Take 6 tabs by mouth daily  for 2 days, then 5 tabs for 2 days, then 4 tabs for 2 days, then 3 tabs for 2 days, 2 tabs for 2 days, then 1 tab by mouth daily for 2 days 11/26/17   Idelle Leech, Pasty Manninen, PA-C  traMADol (ULTRAM) 50 MG tablet Take 1 tablet (50 mg total) by mouth every 6 (six) hours as needed. 11/26/17   Dietrich Pates, PA-C    Family History Family History  Problem Relation Age of Onset  . Cervical cancer Maternal Aunt   . Fibromyalgia Mother   . Stroke Mother   . Migraines Mother   . Colon cancer Neg Hx     Social History Social History   Tobacco Use  . Smoking status: Never Smoker  . Smokeless tobacco: Never Used  Substance Use Topics  . Alcohol use: No  . Drug use: No     Allergies   Bee venom and Nitrous oxide   Review of Systems Review of Systems   Constitutional: Negative for chills and fever.  Musculoskeletal: Positive for back pain and myalgias.  Skin: Negative for wound.  Neurological: Negative for weakness and numbness.     Physical Exam Updated Vital Signs BP (!) 157/102 (BP Location: Right Arm)   Pulse 72   Temp 98.2 F (36.8 C)   Resp 19   Ht 5\' 6"  (1.676 m)   Wt 109.8 kg   LMP 11/22/2017   SpO2 100%   BMI 39.06 kg/m   Physical Exam  Constitutional: She appears well-developed and well-nourished. No distress.  Ambulatory.  HENT:  Head: Normocephalic and atraumatic.  Eyes: Conjunctivae and EOM are normal. No scleral icterus.  Neck: Normal range of motion.  Cardiovascular: Normal rate, regular rhythm and normal heart sounds.  Pulmonary/Chest: Effort normal and breath sounds normal. No respiratory distress.  Abdominal: There is no tenderness.  Musculoskeletal:       Back:  To palpation of the lumbar spine at the midline paraspinal musculature bilaterally. No midline spinal tenderness present in thoracic or cervical spine. No step-off palpated. No visible bruising, edema or temperature change noted. No objective signs of numbness present. No saddle anesthesia. 2+ DP pulses bilaterally. Sensation intact to light touch. Strength 5/5 in bilateral lower extremities.  Neurological: She is alert.  Skin: No rash noted. She is not diaphoretic.  Psychiatric: She has a normal mood and affect.  Nursing note and vitals reviewed.    ED Treatments / Results  Labs (all labs ordered are listed, but only abnormal results are displayed) Labs Reviewed  POC URINE PREG, ED    EKG None  Radiology Dg Lumbar Spine Complete  Result Date: 11/26/2017 CLINICAL DATA:  27 year old female with increasing lower back pain over several days. No known injury. Initial encounter. EXAM: LUMBAR SPINE - COMPLETE 4+ VIEW COMPARISON:  07/31/2016 CT. FINDINGS: Minimal curvature lumbar spine possibly positional. Minimal L5-S1 disc space  narrowing. Otherwise no significant disc space narrowing. No compression fracture or pars defect. Post cholecystectomy. IMPRESSION: Minimal L5-S1 disc space narrowing. Electronically Signed   By: Lacy Duverney M.D.   On: 11/26/2017 18:33    Procedures Procedures (including critical care time)  Medications Ordered in ED Medications  lidocaine (LIDODERM) 5 % 1 patch (1 patch Transdermal Patch Applied 11/26/17 1700)  oxyCODONE-acetaminophen (PERCOCET/ROXICET) 5-325 MG per tablet 1 tablet (1 tablet Oral Given 11/26/17 1649)  ondansetron (ZOFRAN-ODT) disintegrating tablet 4 mg (4 mg Oral Given  11/26/17 1802)     Initial Impression / Assessment and Plan / ED Course  I have reviewed the triage vital signs and the nursing notes.  Pertinent labs & imaging results that were available during my care of the patient were reviewed by me and considered in my medical decision making (see chart for details).     Patient denies any concerning symptoms suggestive of cauda equina requiring urgent imaging at this time such as loss of sensation in the lower extremities, lower extremity weakness, loss of bowel or bladder control, saddle anesthesia, urinary retention, fever/chills, IVDU. Exam demonstrated no  weakness on exam today. No preceding injury or trauma to suggest acute fracture. Doubt pelvic or urinary pathology for patient's acute back pain, as patient denies urinary symptoms and is not pregnant as evidenced by negative POC urine pregnancy test. Doubt AAA as cause of patient's back pain as patient lacks major risk factors, had no abdominal TTP, and has symmetric and intact distal pulses. Patient given strict return precautions for any symptoms indicating worsening neurologic function in the lower extremities. Will treat for sciatica with steroid Dosepak, short course of pain medication, muscle relaxer.  Encouraged neurosurgery and PCP follow-up for ultimate evaluation.  Advised to return to ED for any severe  worsening symptoms. [place] PMP reviewed with no discrepancies.  Portions of this note were generated with Scientist, clinical (histocompatibility and immunogenetics). Dictation errors may occur despite best attempts at proofreading.  Final Clinical Impressions(s) / ED Diagnoses   Final diagnoses:  Bilateral sciatica    ED Discharge Orders         Ordered    predniSONE (STERAPRED UNI-PAK 21 TAB) 10 MG (21) TBPK tablet  Daily     11/26/17 1859    cyclobenzaprine (FLEXERIL) 10 MG tablet  2 times daily PRN     11/26/17 1859    traMADol (ULTRAM) 50 MG tablet  Every 6 hours PRN     11/26/17 1859           Dietrich Pates, PA-C 11/26/17 1903    Bethann Berkshire, MD 11/26/17 2336

## 2017-11-26 NOTE — Discharge Instructions (Signed)
Return to ED for worsening symptoms, numbness in arms or legs, injuries or falls, losing control of your bladder, fever, chest pain or shortness of breath. You can use salonpas patches in the area they are effective at for pain control as well.

## 2017-11-26 NOTE — ED Notes (Signed)
Patient transported to X-ray 

## 2017-11-26 NOTE — ED Notes (Signed)
ED Provider at bedside. 

## 2017-11-26 NOTE — ED Notes (Signed)
Pt c/o nausea. Made Hina PA aware.

## 2018-02-05 ENCOUNTER — Encounter (HOSPITAL_BASED_OUTPATIENT_CLINIC_OR_DEPARTMENT_OTHER): Payer: Self-pay | Admitting: Emergency Medicine

## 2018-02-05 ENCOUNTER — Emergency Department (HOSPITAL_BASED_OUTPATIENT_CLINIC_OR_DEPARTMENT_OTHER)
Admission: EM | Admit: 2018-02-05 | Discharge: 2018-02-05 | Disposition: A | Discharge status: Home / Self Care | Payer: BLUE CROSS/BLUE SHIELD | Attending: Emergency Medicine | Admitting: Emergency Medicine

## 2018-02-05 ENCOUNTER — Other Ambulatory Visit: Payer: Self-pay

## 2018-02-05 DIAGNOSIS — N72 Inflammatory disease of cervix uteri: Secondary | ICD-10-CM | POA: Diagnosis not present

## 2018-02-05 DIAGNOSIS — Z9049 Acquired absence of other specified parts of digestive tract: Secondary | ICD-10-CM | POA: Insufficient documentation

## 2018-02-05 DIAGNOSIS — A5901 Trichomonal vulvovaginitis: Secondary | ICD-10-CM | POA: Diagnosis not present

## 2018-02-05 DIAGNOSIS — N898 Other specified noninflammatory disorders of vagina: Secondary | ICD-10-CM | POA: Diagnosis present

## 2018-02-05 LAB — WET PREP, GENITAL
SPERM: NONE SEEN
Yeast Wet Prep HPF POC: NONE SEEN

## 2018-02-05 LAB — PREGNANCY, URINE: Preg Test, Ur: NEGATIVE

## 2018-02-05 MED ORDER — METRONIDAZOLE 500 MG PO TABS: 500.0000 mg | ORAL_TABLET | Freq: Two times a day (BID) | ORAL | 0 refills | Status: AC

## 2018-02-05 MED ORDER — FLUCONAZOLE 150 MG PO TABS: 150.0000 mg | ORAL_TABLET | Freq: Once | ORAL | 0 refills | Status: AC

## 2018-02-05 MED ORDER — AZITHROMYCIN 250 MG PO TABS
1000.0000 mg | ORAL_TABLET | Freq: Once | ORAL | Status: AC
  Administered 2018-02-05: 1000 mg via ORAL
  Filled 2018-02-05: qty 4

## 2018-02-05 MED ORDER — LIDOCAINE HCL (PF) 1 % IJ SOLN
INTRAMUSCULAR | Status: AC
  Administered 2018-02-05: 1.2 mL
  Filled 2018-02-05: qty 5

## 2018-02-05 MED ORDER — ONDANSETRON 8 MG PO TBDP
8.0000 mg | ORAL_TABLET | Freq: Once | ORAL | Status: AC
  Administered 2018-02-05: 8 mg via ORAL
  Filled 2018-02-05: qty 1

## 2018-02-05 MED ORDER — CEFTRIAXONE SODIUM 250 MG IJ SOLR
250.0000 mg | Freq: Once | INTRAMUSCULAR | Status: AC
  Administered 2018-02-05: 250 mg via INTRAMUSCULAR
  Filled 2018-02-05: qty 250

## 2018-02-05 MED FILL — metroNIDAZOLE 500 MG TABS: 500 | 7 days supply | Qty: 14 | Fill #0

## 2018-02-05 NOTE — ED Triage Notes (Signed)
Vaginal discharge x3 days with foul odor.  Also a pain in right side shooting into right breast started yesterday morning.

## 2018-02-05 NOTE — ED Provider Notes (Signed)
[facility] EMERGENCY DEPARTMENT Provider Note   CSN: [id number] Arrival date & time: 02/05/18  4098     History   Chief Complaint Chief Complaint  Patient presents with  . Vaginal Discharge    HPI Regina Nelson is a 27 y.o. female.  27 year old female with past medical history below who presents with vaginal discharge.  Patient states that she has had 3 days of foul-smelling vaginal discharge.  She denies any associated vaginal burning, urinary symptoms, lower abdominal pain, fevers, or vomiting.  She is sexually active with one partner, does not use protection and is not currently on oral contraceptives.  Last menstrual cycle was early November.  The history is provided by the patient.  Vaginal Discharge      Past Medical History:  Diagnosis Date  . DDD (degenerative disc disease), lumbar   . DDD (degenerative disc disease), lumbar   . Degenerative disc disease   . Degenerative joint disease of spine   . Hernia of abdominal wall   . STD (female)     Patient Active Problem List   Diagnosis Date Noted  . Wound, open, abdominal wall, lateral 06/08/2015  . Hernia of abdominal wall 06/08/2015  . 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 Allergy or toxic reaction to venom 06/08/2015  . Edema in pregnancy, postpartum condition 06/07/2015  . Status post primary low transverse cesarean section--FTD 06/02/2015  . Pre-eclampsia in third trimester 06/02/2015  . Pregnancy induced hypertension 05/31/2015  . Morbid obesity with BMI of 40.0-44.9, adult (HCC) 05/20/2015  . Diastasis recti 05/20/2015  . Hx of peptic ulcer 05/20/2015    Past Surgical History:  Procedure Laterality Date  . CESAREAN SECTION N/A 06/01/2015   Procedure: CESAREAN SECTION;  Surgeon: Jaymes Graff, MD;  Location: [facility];  Service: Obstetrics;  Laterality: N/A;  . CHOLECYSTECTOMY    . WISDOM TOOTH EXTRACTION       OB History    Gravida  1   Para  1   Term  1   Preterm      AB      Living  1     SAB      TAB      Ectopic      Multiple  0   Live Births  1            Home Medications    Prior to Admission medications   Medication Sig Start Date End Date Taking? Authorizing Provider  cyclobenzaprine (FLEXERIL) 10 MG tablet Take 1 tablet (10 mg total) by mouth 2 (two) times daily as needed for muscle spasms. 11/26/17   Khatri, Hina, PA-C  fluconazole (DIFLUCAN) 150 MG tablet Take 1 tablet (150 mg total) by mouth once for 1 dose. Take for any symptoms of yeast infection. 02/05/18 02/05/18  Chalet Kerwin, Ambrose Finland, MD  ibuprofen (ADVIL,MOTRIN) 800 MG tablet Take 1 tablet (800 mg total) by mouth 3 (three) times daily. 04/15/17   Mackuen, Courteney Lyn, MD  metroNIDAZOLE (FLAGYL) 500 MG tablet Take 1 tablet (500 mg total) by mouth 2 (two) times daily. 02/05/18   Alleene Stoy, Ambrose Finland, MD    Family History Family History  Problem Relation Age of Onset  . Cervical cancer Maternal Aunt   . Fibromyalgia Mother   . Stroke Mother   . Migraines Mother   . Colon cancer Neg Hx     Social History Social History   Tobacco Use  . Smoking status: Never Smoker  .  Smokeless tobacco: Never Used  Substance Use Topics  . Alcohol use: No  . Drug use: No     Allergies   Bee venom and Nitrous oxide   Review of Systems Review of Systems  Genitourinary: Positive for vaginal discharge.   All other systems reviewed and are negative except that which was mentioned in HPI   Physical Exam Updated Vital Signs BP 122/76 (BP Location: Right Arm)   Pulse 63   Temp 98 F (36.7 C)   Resp 16   Ht 5\' 6"  (1.676 m)   Wt 104.3 kg   SpO2 98%   BMI 37.12 kg/m   Physical Exam Vitals signs and nursing note reviewed. Exam conducted with a chaperone present.  Constitutional:      General: She is not in acute distress.    Appearance: She is well-developed.  HENT:     Head: Normocephalic and atraumatic.  Eyes:     Conjunctiva/sclera:  Conjunctivae normal.  Neck:     Musculoskeletal: Neck supple.  Cardiovascular:     Rate and Rhythm: Normal rate and regular rhythm.     Heart sounds: Normal heart sounds. No murmur.  Pulmonary:     Effort: Pulmonary effort is normal.     Breath sounds: Normal breath sounds.  Abdominal:     General: Bowel sounds are normal. There is no distension.     Palpations: Abdomen is soft.     Tenderness: There is no abdominal tenderness.  Genitourinary:    General: Normal vulva.     Vagina: Vaginal discharge (copious thin white) present.     Cervix: No cervical motion tenderness, friability, erythema or cervical bleeding.     Adnexa: Right adnexa normal and left adnexa normal.  Skin:    General: Skin is warm and dry.  Neurological:     Mental Status: She is alert and oriented to person, place, and time.     Comments: Fluent speech  Psychiatric:        Judgment: Judgment normal.      ED Treatments / Results  Labs (all labs ordered are listed, but only abnormal results are displayed) Labs Reviewed  WET PREP, GENITAL - Abnormal; Notable for the following components:      Result Value   Trich, Wet Prep PRESENT (*)    Clue Cells Wet Prep HPF POC PRESENT (*)    WBC, Wet Prep HPF POC MANY (*)    All other components within normal limits  PREGNANCY, URINE  GC/CHLAMYDIA PROBE AMP ([place]) NOT AT [facility]    EKG None  Radiology No results found.  Procedures Procedures (including critical care time)  Medications Ordered in ED Medications  cefTRIAXone (ROCEPHIN) injection 250 mg (has no administration in time range)  ondansetron (ZOFRAN-ODT) disintegrating tablet 8 mg (has no administration in time range)  azithromycin (ZITHROMAX) tablet 1,000 mg (has no administration in time range)     Initial Impression / Assessment and Plan / ED Course  I have reviewed the triage vital signs and the nursing notes.  Pertinent labs  that were available during my care of the patient were  reviewed by me and considered in my medical decision making (see chart for details).      No evidence of PID on exam. UPT negative. Wet prep + for trich and clue cells. Recommended treatment w/ CTX and azithro to cover GC/chlamydia as well as flagyl for trich. Per request, gave prescription for diflucan in case she develops yeast infection. Instructed  to have partners tested and treated prior to intercourse.  Counseled on safe sex practices and cautioned on being on folate or prenatal vitamins if having unprotected sex without birth control. Return precautions reviewed.  Final Clinical Impressions(s) / ED Diagnoses   Final diagnoses:  Cervicitis  Trichomonas vaginitis    ED Discharge Orders         Ordered    metroNIDAZOLE (FLAGYL) 500 MG tablet  2 times daily     02/05/18 0823    fluconazole (DIFLUCAN) 150 MG tablet   Once     02/05/18 0824           Aidin Doane, Ambrose Finland, MD 02/05/18 [phone]

## 2018-02-05 NOTE — ED Notes (Signed)
ED Provider at bedside. 

## 2018-02-05 NOTE — Discharge Instructions (Addendum)
Have all partners tested and treated before engaging in sexual intercourse.

## 2018-02-06 LAB — GC/CHLAMYDIA PROBE AMP (~~LOC~~) NOT AT ARMC
Chlamydia: POSITIVE — AB
Neisseria Gonorrhea: NEGATIVE

## 2018-02-27 ENCOUNTER — Encounter (HOSPITAL_COMMUNITY): Payer: Self-pay

## 2018-02-27 ENCOUNTER — Emergency Department (HOSPITAL_COMMUNITY): Payer: BLUE CROSS/BLUE SHIELD

## 2018-02-27 ENCOUNTER — Other Ambulatory Visit: Payer: Self-pay

## 2018-02-27 ENCOUNTER — Emergency Department (HOSPITAL_COMMUNITY)
Admission: EM | Admit: 2018-02-27 | Discharge: 2018-02-28 | Disposition: A | Discharge status: Home / Self Care | Payer: BLUE CROSS/BLUE SHIELD | Attending: Emergency Medicine | Admitting: Emergency Medicine

## 2018-02-27 DIAGNOSIS — Y92410 Unspecified street and highway as the place of occurrence of the external cause: Secondary | ICD-10-CM | POA: Insufficient documentation

## 2018-02-27 DIAGNOSIS — R110 Nausea: Secondary | ICD-10-CM | POA: Insufficient documentation

## 2018-02-27 DIAGNOSIS — R51 Headache: Secondary | ICD-10-CM | POA: Insufficient documentation

## 2018-02-27 DIAGNOSIS — Z79899 Other long term (current) drug therapy: Secondary | ICD-10-CM | POA: Insufficient documentation

## 2018-02-27 DIAGNOSIS — Y9301 Activity, walking, marching and hiking: Secondary | ICD-10-CM | POA: Insufficient documentation

## 2018-02-27 DIAGNOSIS — M25552 Pain in left hip: Secondary | ICD-10-CM | POA: Insufficient documentation

## 2018-02-27 DIAGNOSIS — Y999 Unspecified external cause status: Secondary | ICD-10-CM | POA: Insufficient documentation

## 2018-02-27 DIAGNOSIS — S41012A Laceration without foreign body of left shoulder, initial encounter: Secondary | ICD-10-CM

## 2018-02-27 DIAGNOSIS — Z23 Encounter for immunization: Secondary | ICD-10-CM | POA: Insufficient documentation

## 2018-02-27 DIAGNOSIS — M542 Cervicalgia: Secondary | ICD-10-CM | POA: Insufficient documentation

## 2018-02-27 DIAGNOSIS — S0101XA Laceration without foreign body of scalp, initial encounter: Secondary | ICD-10-CM

## 2018-02-27 DIAGNOSIS — H538 Other visual disturbances: Secondary | ICD-10-CM | POA: Insufficient documentation

## 2018-02-27 LAB — HCG, QUANTITATIVE, PREGNANCY: hCG, Beta Chain, Quant, S: <1 m[IU]/mL (ref <5)

## 2018-02-27 MED ORDER — TETANUS-DIPHTH-ACELL PERTUSSIS 5-2.5-18.5 LF-MCG/0.5 IM SUSP
0.5000 mL | Freq: Once | INTRAMUSCULAR | Status: AC
  Administered 2018-02-27: 0.5 mL via INTRAMUSCULAR
  Filled 2018-02-27: qty 0.5

## 2018-02-27 MED ORDER — MORPHINE SULFATE (PF) 4 MG/ML IV SOLN
4.0000 mg | Freq: Once | INTRAVENOUS | Status: AC
  Administered 2018-02-27: 4 mg via INTRAVENOUS
  Filled 2018-02-27: qty 1

## 2018-02-27 MED ORDER — ONDANSETRON HCL 4 MG/2ML IJ SOLN
4.0000 mg | Freq: Once | INTRAMUSCULAR | Status: AC
  Administered 2018-02-27: 4 mg via INTRAVENOUS
  Filled 2018-02-27: qty 2

## 2018-02-27 MED ORDER — LIDOCAINE-EPINEPHRINE-TETRACAINE (LET) SOLUTION
3.0000 mL | Freq: Once | NASAL | Status: AC
  Administered 2018-02-27: 3 mL via TOPICAL
  Filled 2018-02-27: qty 3
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
## 2018-02-27 NOTE — ED Notes (Signed)
Called rad to let them know hCG resulted and pt is ready for xray

## 2018-02-27 NOTE — ED Notes (Signed)
ED Provider at bedside. 

## 2018-02-27 NOTE — ED Notes (Signed)
Suture cart at bedside, gel applied and pt medicated.
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
## 2018-02-28 MED ORDER — ONDANSETRON HCL 4 MG PO TABS: 4.0000 mg | ORAL_TABLET | Freq: Three times a day (TID) | ORAL | 0 refills | Status: AC | PRN

## 2018-02-28 MED ORDER — OXYCODONE-ACETAMINOPHEN 5-325 MG PO TABS: 1.0000 | ORAL_TABLET | ORAL | 0 refills | Status: DC | PRN

## 2018-02-28 MED ORDER — ONDANSETRON HCL 4 MG/2ML IJ SOLN
4.0000 mg | Freq: Once | INTRAMUSCULAR | Status: AC
  Administered 2018-02-28: 4 mg via INTRAVENOUS
  Filled 2018-02-28: qty 2

## 2018-02-28 NOTE — ED Notes (Signed)
Pt given one last dose of zofran before discharge
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
## 2018-02-28 NOTE — ED Notes (Signed)
Pt discharged from ED; instructions provided and scripts given; Pt encouraged to return to ED if symptoms worsen and to f/u with PCP; Pt verbalized understanding of all instructions 

## 2018-03-04 ENCOUNTER — Other Ambulatory Visit: Payer: Self-pay

## 2018-03-04 ENCOUNTER — Emergency Department (HOSPITAL_COMMUNITY)
Admission: EM | Admit: 2018-03-04 | Discharge: 2018-03-04 | Disposition: A | Discharge status: Home / Self Care | Payer: Self-pay | Attending: Emergency Medicine | Admitting: Emergency Medicine

## 2018-03-04 DIAGNOSIS — Z5189 Encounter for other specified aftercare: Secondary | ICD-10-CM

## 2018-03-04 DIAGNOSIS — Z4800 Encounter for change or removal of nonsurgical wound dressing: Secondary | ICD-10-CM | POA: Insufficient documentation

## 2018-03-04 NOTE — Discharge Instructions (Addendum)
Please read attached information. If you experience any new or worsening signs or symptoms please return to the emergency room for evaluation. Please follow-up with your primary care provider or specialist as discussed.  °

## 2018-03-04 NOTE — ED Notes (Signed)
Patient verbalizes understanding of discharge instructions. Opportunity for questioning and answers were provided. Armband removed by staff, pt discharged from ED.  
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
## 2018-03-04 NOTE — ED Triage Notes (Signed)
States that she had staples placed in her head on 1/9 , now having pain in her head behind her ear , stabbing pain comes and goes

## 2018-06-03 ENCOUNTER — Other Ambulatory Visit: Payer: Self-pay

## 2018-06-03 ENCOUNTER — Emergency Department (HOSPITAL_BASED_OUTPATIENT_CLINIC_OR_DEPARTMENT_OTHER): Payer: BLUE CROSS/BLUE SHIELD

## 2018-06-03 ENCOUNTER — Encounter (HOSPITAL_BASED_OUTPATIENT_CLINIC_OR_DEPARTMENT_OTHER): Payer: Self-pay

## 2018-06-03 ENCOUNTER — Emergency Department (HOSPITAL_BASED_OUTPATIENT_CLINIC_OR_DEPARTMENT_OTHER)
Admission: EM | Admit: 2018-06-03 | Discharge: 2018-06-03 | Disposition: A | Discharge status: Home / Self Care | Payer: BLUE CROSS/BLUE SHIELD | Attending: Emergency Medicine | Admitting: Emergency Medicine

## 2018-06-03 DIAGNOSIS — M545 Low back pain, unspecified: Secondary | ICD-10-CM

## 2018-06-03 DIAGNOSIS — M5416 Radiculopathy, lumbar region: Secondary | ICD-10-CM | POA: Diagnosis not present

## 2018-06-03 HISTORY — DX: Other intervertebral disc degeneration, lumbar region: M51.36

## 2018-06-03 HISTORY — DX: Other intervertebral disc degeneration, lumbar region without mention of lumbar back pain or lower extremity pain: M51.369

## 2018-06-03 LAB — URINALYSIS, ROUTINE W REFLEX MICROSCOPIC
Bilirubin Urine: NEGATIVE
Glucose, UA: NEGATIVE mg/dL
Hgb urine dipstick: NEGATIVE
Ketones, ur: 15 mg/dL — AB
Leukocytes,Ua: NEGATIVE
Nitrite: NEGATIVE
Protein, ur: NEGATIVE mg/dL
Specific Gravity, Urine: >1.03 — ABNORMAL HIGH (ref 1.005–1.030)
pH: 6 (ref 5.0–8.0)

## 2018-06-03 LAB — PREGNANCY, URINE: Preg Test, Ur: NEGATIVE

## 2018-06-03 MED ORDER — OXYCODONE-ACETAMINOPHEN 5-325 MG PO TABS
1.0000 | ORAL_TABLET | Freq: Once | ORAL | Status: AC
  Administered 2018-06-03: 1 via ORAL
  Filled 2018-06-03: qty 1

## 2018-06-03 MED ORDER — POLYETHYLENE GLYCOL 3350 17 G PO PACK: 17.0000 g | PACK | Freq: Every day | ORAL | 0 refills | Status: AC

## 2018-06-03 MED ORDER — METHOCARBAMOL 500 MG PO TABS: 500.0000 mg | ORAL_TABLET | Freq: Two times a day (BID) | ORAL | 0 refills | Status: DC

## 2018-06-03 MED ORDER — DOCUSATE SODIUM 250 MG PO CAPS: 250.0000 mg | ORAL_CAPSULE | Freq: Every day | ORAL | 0 refills | Status: AC

## 2018-06-03 MED ORDER — OXYCODONE-ACETAMINOPHEN 5-325 MG PO TABS: 1.0000 | ORAL_TABLET | Freq: Four times a day (QID) | ORAL | 0 refills | Status: AC | PRN

## 2018-06-03 MED ORDER — DICLOFENAC SODIUM 1 % TD GEL: 2.0000 g | Freq: Four times a day (QID) | TRANSDERMAL | 0 refills | Status: AC

## 2018-06-03 MED ORDER — METHYLPREDNISOLONE 4 MG PO TBPK: ORAL_TABLET | ORAL | 0 refills | Status: AC

## 2018-06-03 MED ORDER — KETOROLAC TROMETHAMINE 30 MG/ML IJ SOLN
30.0000 mg | Freq: Once | INTRAMUSCULAR | Status: AC
  Administered 2018-06-03: 18:00:00 30 mg via INTRAMUSCULAR
  Filled 2018-06-03: qty 1
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
## 2018-06-03 NOTE — ED Notes (Signed)
Patient transported to X-ray 

## 2018-06-03 NOTE — ED Triage Notes (Addendum)
Pt reports back pain that started Saturday and has gradually been getting worse with radiation down R leg. Pt denies injury. Pt taking ibuprofen, APAP, and Salonpas without relief.

## 2018-06-03 NOTE — ED Notes (Signed)
Pt returned from xray

## 2018-06-03 NOTE — Discharge Instructions (Signed)
Take Robaxin twice daily as needed for muscle pain and spasms.  Take this instead of the muscle relaxer you have at home.  Do not drive or operate machinery while taking this medicine.  Take Medrol Dosepak until completed.  Apply Voltaren gel to the most painful areas 4 times daily.  Do not take oral NSAID medication such as Aleve or ibuprofen while taking the Medrol Dosepak, however once completed, you can resume taking these medications.  For breakthrough pain, take 1-2 Percocet every 6 hours.  Use ice and heat alternating 20 minutes on, 20 minutes off.  Attempt the exercises and stretches daily as tolerated.  Your x-ray showed that you are constipated.  Take MiraLAX daily until your producing regular bowel movements.  Also take Colace daily to soften your stool.  Please follow-up with Dr. Pearletha Forge for further evaluation and treatment of any ongoing back pain.  Please return the emergency department if you develop any new or worsening symptoms including complete numbness of your legs, groin, or loss of bowel or bladder control.  Do not drink alcohol, drive, operate machinery or participate in any other potentially dangerous activities while taking opiate pain medication as it may make you sleepy. Do not take this medication with any other sedating medications, either prescription or over-the-counter. If you were prescribed Percocet or Vicodin, do not take these with acetaminophen (Tylenol) as it is already contained within these medications and overdose of Tylenol is dangerous.   This medication is an opiate (or narcotic) pain medication and can be habit forming.  Use it as little as possible to achieve adequate pain control.  Do not use or use it with extreme caution if you have a history of opiate abuse or dependence. This medication is intended for your use only - do not give any to anyone else and keep it in a secure place where nobody else, especially children, have access to it. It will also cause or  worsen constipation, so you may want to consider taking an over-the-counter stool softener while you are taking this medication.

## 2018-06-04 ENCOUNTER — Other Ambulatory Visit: Payer: Self-pay

## 2018-06-04 ENCOUNTER — Encounter: Payer: Self-pay | Admitting: Family Medicine

## 2018-06-04 ENCOUNTER — Ambulatory Visit (INDEPENDENT_AMBULATORY_CARE_PROVIDER_SITE_OTHER): Payer: Self-pay | Admitting: Family Medicine

## 2018-06-04 DIAGNOSIS — M545 Low back pain, unspecified: Secondary | ICD-10-CM | POA: Insufficient documentation

## 2018-06-04 DIAGNOSIS — M5416 Radiculopathy, lumbar region: Secondary | ICD-10-CM

## 2018-06-04 DIAGNOSIS — G8929 Other chronic pain: Secondary | ICD-10-CM

## 2018-06-04 MED ORDER — METHOCARBAMOL 500 MG PO TABS: 500.0000 mg | ORAL_TABLET | Freq: Three times a day (TID) | ORAL | 1 refills | Status: AC | PRN

## 2018-06-04 MED ORDER — HYDROCODONE-ACETAMINOPHEN 5-325 MG PO TABS: 1.0000 | ORAL_TABLET | Freq: Four times a day (QID) | ORAL | 0 refills | Status: AC | PRN
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
## 2018-06-04 NOTE — Assessment & Plan Note (Signed)
-   since age 28 - this problem appears worse - responded well in the past to treatment modalities. MRI reviewed from 2010 as above from [facility]

## 2018-06-04 NOTE — Patient Instructions (Addendum)
You have lumbar radiculopathy (a pinched nerve in your low back). Take tylenol for baseline pain relief (1-2 extra strength tabs 3x/day) Continue the prednisone - if you can take it as prescribed that would be ideal but if the side effects are too bothersome then space out the pills more (I.e. 2-3 tabs a day instead). Norco as needed for severe pain (no driving on this medicine). Robaxin as needed for muscle spasms - I sent in more of this if you need it. Stay as active as possible. Physical therapy has been shown to be helpful as well - start this for modalities to start with and then add strengthening/stretching when tolerated. Strengthening of low back muscles, abdominal musculature are key for long term pain relief. Follow up with me in 1 week or call me in a week to let me know how you're doing. We can consider neurosurgery referral, nortriptyline if not improving as expected.

## 2018-06-04 NOTE — Assessment & Plan Note (Signed)
-   independently reviewed lumbar x-rays from 4/14 demonstrating no acute process - also did review MRI report from 2010 demonstrating a central disc protrusion at L4-L5 w/ mild bilateral neuroforaminal narrowing - recommend Robaxin 500mg  bid, caution on side effects which were reviewed today - continue 6d Medrol dosepak until completion. Call if side effects are intolerable as we reviewed these today - heating pad TID prn - referral to physical therapy placed for treatment modalities - f/u or call in 1 week with update on status - if not improving, will repeat MRI - no red flag sx today

## 2018-06-06 ENCOUNTER — Encounter: Payer: Self-pay | Admitting: Family Medicine

## 2018-06-09 ENCOUNTER — Ambulatory Visit: Payer: Self-pay | Admitting: Family Medicine

## 2018-06-12 ENCOUNTER — Ambulatory Visit: Payer: BLUE CROSS/BLUE SHIELD | Attending: Family Medicine | Admitting: Physical Therapy

## 2018-06-12 ENCOUNTER — Encounter: Payer: Self-pay | Admitting: Physical Therapy

## 2018-06-12 ENCOUNTER — Other Ambulatory Visit: Payer: Self-pay

## 2018-06-12 DIAGNOSIS — M5441 Lumbago with sciatica, right side: Secondary | ICD-10-CM

## 2018-06-12 DIAGNOSIS — R293 Abnormal posture: Secondary | ICD-10-CM | POA: Insufficient documentation

## 2018-06-12 DIAGNOSIS — M6281 Muscle weakness (generalized): Secondary | ICD-10-CM | POA: Diagnosis present

## 2018-06-12 NOTE — Therapy (Signed)
[facility] [address] [place], [place], [address] Phone: [phone]   Fax:  [phone]  Physical Therapy Evaluation  Patient Details  Name: Regina Nelson MRN: [id number] Date of Birth: 10/29/1990 Referring Provider (PT): Dr Norton Blizzard    Encounter Date: 06/12/2018  PT End of Session - 06/12/18 1609    Visit Number  1    Number of Visits  12    Date for PT Re-Evaluation  07/24/18    Authorization Type  BCBS    PT Start Time  1500    PT Stop Time  1558    PT Time Calculation (min)  58 min    Activity Tolerance  Patient tolerated treatment well    Behavior During Therapy  [facility] for tasks assessed/performed       Past Medical History:  Diagnosis Date  . DDD (degenerative disc disease), lumbar   . DDD (degenerative disc disease), lumbar   . Degenerative disc disease   . Degenerative disc disease, lumbar   . Degenerative joint disease of spine   . Hernia of abdominal wall   . STD (female)     Past Surgical History:  Procedure Laterality Date  . CESAREAN SECTION N/A 06/01/2015   Procedure: CESAREAN SECTION;  Surgeon: Jaymes Graff, MD;  Location: [facility];  Service: Obstetrics;  Laterality: N/A;  . CHOLECYSTECTOMY    . WISDOM TOOTH EXTRACTION      There were no vitals filed for this visit.   Subjective Assessment - 06/12/18 1505    Subjective  Patient had the inital onset of low back pain on April 12th. Her pain at that time was bad enough to go to the ED. since that point it has improved but she is still having significant pain.     How long can you sit comfortably?  About 1 hour before she starts to feel low back pain    How long can you stand comfortably?  Can stand but has to shift her weight to the left    How long can you walk comfortably?  Walking is OK     Currently in Pain?  Yes    Pain Score  7     Pain Location  Back    Pain Orientation  Right    Pain Descriptors / Indicators  Aching    Pain  Type  Acute pain    Pain Radiating Towards  radiates to lateral calf muscle     Aggravating Factors   Standing     Pain Relieving Factors  changing position;     Effect of Pain on Daily Activities  difficulty perfroming HEP    Multiple Pain Sites  Yes    Aggravating Factors   when patient lies at home or sits for a long period of time          [facility] PT Assessment - 06/12/18 0001      Assessment   Medical Diagnosis  Right Lower Back Pain with Right Radiculopathy     Referring Provider (PT)  Dr Norton Blizzard     Onset Date/Surgical Date  06/01/18    Next MD Visit  Nothing scheduled     Prior Therapy  Has had it in highschool       Precautions   Precautions  None      Restrictions   Weight Bearing Restrictions  No      Balance Screen   Has the patient fallen in the past 6  months  No      Home Public house manager residence      Prior Function   Level of Independence  Independent    Vocation  Full time employment    Vocation Requirements  [employer] at [employer]     Leisure  Has a 24 year old son       Cognition   Overall Cognitive Status  Within Functional Limits for tasks assessed    Attention  Focused    Focused Attention  Appears intact    Memory  Appears intact    Awareness  Appears intact    Problem Solving  Appears intact      Observation/Other Assessments   Observations  Sits shifted to the left; in standing patient has a significant lateral shift to the left.      Sensation   Light Touch  Appears Intact      Coordination   Gross Motor Movements are Fluid and Coordinated  Yes    Fine Motor Movements are Fluid and Coordinated  Yes      Posture/Postural Control   Posture Comments  hyper lordodic; sits dshifted to the left      ROM / Strength   AROM / PROM / Strength  AROM;PROM;Strength      AROM   AROM Assessment Site  Lumbar    Lumbar Flexion  limited 50% minor pain     Lumbar Extension  limited 75% with significant pain      Lumbar - Right Side Bend  Pain     Lumbar - Right Rotation  Pain at end range     Lumbar - Left Rotation  No pain       PROM   Overall PROM Comments  Passive riht hip flexion to 88 with pressure in the back; IR with pain.       Strength   Strength Assessment Site  Lumbar;Hip;Knee    Right/Left Hip  Left    Left Hip Flexion  3/5    Left Hip ABduction  4/5    Left Hip ADduction  4/5    Lumbar Flexion  4/5    Lumbar Extension  3+/5      Palpation   Spinal mobility  unable to get patient into prone; w    SI assessment   unable to get patient into prone 2nd to pain     Palpation comment  sinificant tenderness to palpation in the right lumbar paraspinals and into the right gluteals       Bed Mobility   Bed Mobility  --   reviwed proper log roll      Transfers   Comments  slow transfer from sit to stand especially after prolonged position       Ambulation/Gait   Gait Comments  decreased left hip flexion; increased abduction                 Objective measurements completed on examination: See above findings.      [facility] Adult PT Treatment/Exercise - 06/12/18 0001      Manual Therapy   Manual Therapy  Joint mobilization;Soft tissue mobilization;Manual Traction    Joint Mobilization  Attmpeted to correct lateral shift in standing but the patient had increased symptoms into her foot    Manual Traction  attmpeted light lad grade II and III of the right LE. On the first trail she felt a stretch in herback but no increase or decrease of radicular  symptoms; on the second trial she had increased radicular symptoms; consdier trying left side but she also may have been lying flat for too long              PT Education - 06/12/18 1616    Education Details  Postrue; HEP; centralization; symptom mangement ; use of e-stim     Person(s) Educated  Patient    Methods  Explanation;Tactile cues;Verbal cues;Demonstration    Comprehension  Verbalized understanding;Returned  demonstration;Verbal cues required;Tactile cues required       PT Short Term Goals - 06/12/18 1606      PT SHORT TERM GOAL #1   Title  Patienti will sit without shifting to the left for 5 minutes     Time  3    Period  Weeks    Status  New    Target Date  07/03/18      PT SHORT TERM GOAL #2   Title  Patient will demonstrate 4+/5 gross seated Bilateral LE strength     Time  3    Period  Weeks    Status  New    Target Date  07/03/18      PT SHORT TERM GOAL #3   Title  Patient will be indepdent with basic HEP     Time  3    Period  Weeks    Status  New    Target Date  07/03/18        PT Long Term Goals - 06/12/18 1607      PT LONG TERM GOAL #1   Title  Patient will sleep through the night without pain     Time  6    Period  Weeks    Status  New    Target Date  07/24/18      PT LONG TERM GOAL #2   Title  Patient will stand for 30 minutes without increased pain in order to perfrom ADL's     Time  6    Period  Weeks    Status  New    Target Date  07/03/18      PT LONG TERM GOAL #3   Title  Paytient will be Indepdnent with a complete lower back program to promote fitness and future back health     Time  6    Period  Weeks    Status  New    Target Date  07/24/18             Plan - 06/12/18 1609    Clinical Impression Statement  Patient is a 28 year old female with low back pain that radiatesinto her right leg. She is more limited with extension then flexion despite having increased pain in sitting. She has weakness into her right leg and decreased right hip flexion and IR. Her back is very reactive right now. She would benefit from skilled therapy to correct her lateral shift, decrease radicular pain, and increase functional strength     Personal Factors and Comorbidities  Comorbidity 1;Profession    Comorbidities  obesity     Examination-Activity Limitations  Bend;Sit;Squat;Reach Overhead;Carry    Examination-Participation Restrictions  Yard  Work;Laundry;Cleaning;Community Activity;Driving;Shop    Stability/Clinical Decision Making  Evolving/Moderate complexity   reactive pain that radiates into her leg with any prolonged positioning    Clinical Decision Making  Moderate    Rehab Potential  Good    PT Frequency  2x / week    PT Duration  6 weeks    PT Treatment/Interventions  ADLs/Self Care Home Management;Cryotherapy;Electrical Stimulation;Iontophoresis 4mg /ml Dexamethasone;Moist Heat;Spinal Manipulations;Joint Manipulations;Passive range of motion;Neuromuscular re-education;Patient/family education;Gait training;Traction;Functional mobility training;Therapeutic activities;Therapeutic exercise    PT Next Visit Plan  continue with LAD on the right but may need the left; consider side lying lumbar mobilizations; continue to try to correct the lalteral shift to the left; assess pelvis if patient able to lie flat ( patient had a trauma earlier in the year); begin core strengthening; begin LE strengthening when able modalities PRN; if we can cerntralize her pain consder a manip;     PT Home Exercise Plan  seated hamstring stretch ( respded well to flexion depsite having more pain in sitting) tennis ball trigger point release. advised patient to be aware of posture     Consulted and Agree with Plan of Care  Patient       Patient will benefit from skilled therapeutic intervention in order to improve the following deficits and impairments:  Abnormal gait, Difficulty walking, Decreased endurance, Decreased range of motion, Obesity, Improper body mechanics, Decreased strength, Decreased activity tolerance, Decreased coordination  Visit Diagnosis: Acute bilateral low back pain with right-sided sciatica  Abnormal posture  Muscle weakness (generalized)     Problem List Patient Active Problem List   Diagnosis Date Noted  . Acute right lumbar radiculopathy 06/04/2018  . Chronic low back pain 06/04/2018  . Wound, open, abdominal wall,  lateral 06/08/2015  . Hernia of abdominal wall 06/08/2015  . 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 Allergy or toxic reaction to venom 06/08/2015  . Edema in pregnancy, postpartum condition 06/07/2015  . Status post primary low transverse cesarean section--FTD 06/02/2015  . Pre-eclampsia in third trimester 06/02/2015  . Pregnancy induced hypertension 05/31/2015  . Morbid obesity with BMI of 40.0-44.9, adult (HCC) 05/20/2015  . Diastasis recti 05/20/2015  . Hx of peptic ulcer 05/20/2015    Dessie Comaavid J Anniebelle Devore 06/12/2018, 4:21 PM  [facility] [address] [place], [place], [address] Phone: [phone]   Fax:  [phone]  Name: Brantley PersonsDeonna Rochelle Mooty MRN: [id number] Date of Birth: 05/02/1990

## 2018-06-18 ENCOUNTER — Other Ambulatory Visit: Payer: Self-pay

## 2018-06-18 ENCOUNTER — Ambulatory Visit: Payer: BLUE CROSS/BLUE SHIELD | Admitting: Physical Therapy

## 2018-06-18 DIAGNOSIS — M5441 Lumbago with sciatica, right side: Secondary | ICD-10-CM | POA: Diagnosis not present

## 2018-06-18 DIAGNOSIS — M6281 Muscle weakness (generalized): Secondary | ICD-10-CM

## 2018-06-18 DIAGNOSIS — R293 Abnormal posture: Secondary | ICD-10-CM

## 2018-06-20 NOTE — Therapy (Addendum)
[place], [place], [address] Phone: [phone]   Fax:  [phone]  Physical Therapy Treatment/Discharge addendum PHYSICAL THERAPY DISCHARGE SUMMARY  Visits from Start of Care: 2 Current functional level related to goals / functional outcomes: See below   Remaining deficits: See below   Education / Equipment: HEP Plan: Patient agrees to discharge.  Patient goals were not met. Patient is being discharged due to not returning since the last visit.  ?????    Elsie Ra, PT, DPT 10/15/18 10:51 AM    Patient Details  Name: Regina Nelson MRN: [id number] Date of Birth: [birth year] Referring Provider (PT): Dr Karlton Lemon    Encounter Date: 06/18/2018  PT End of Session - 06/20/18 0927    Visit Number  2    Number of Visits  12    Date for PT Re-Evaluation  07/24/18    Authorization Type  BCBS    PT Start Time  3094    PT Stop Time  1630    PT Time Calculation (min)  55 min    Activity Tolerance  Patient tolerated treatment well    Behavior During Therapy  [facility] for tasks assessed/performed       Past Medical History:  Diagnosis Date  . DDD (degenerative disc disease), lumbar   . DDD (degenerative disc disease), lumbar   . Degenerative disc disease   . Degenerative disc disease, lumbar   . Degenerative joint disease of spine   . Hernia of abdominal wall   . STD (female)     Past Surgical History:  Procedure Laterality Date  . CESAREAN SECTION N/A 06/01/2015   Procedure: CESAREAN SECTION;  Surgeon: Crawford Givens, MD;  Location: [facility];  Service: Obstetrics;  Laterality: N/A;  . CHOLECYSTECTOMY    . WISDOM TOOTH EXTRACTION      There were no vitals filed for this visit.  Subjective Assessment - 06/20/18 0921    Subjective  Pt relays her back is still really bothering her with Rt LE radiculopathy    Currently in Pain?  Yes    Pain Score  7     Pain Location  Back    Pain  Orientation  Right    Pain Descriptors / Indicators  Aching;Numbness;Tingling    Pain Type  Acute pain    Pain Radiating Towards  to Rt leg down to calf                       [facility] Adult PT Treatment/Exercise - 06/20/18 0001      Exercises   Exercises  Lumbar      Lumbar Exercises: Stretches   Active Hamstring Stretch  Right;3 reps;30 seconds    Active Hamstring Stretch Limitations  supine with strap    Lower Trunk Rotation  5 reps;10 seconds    Pelvic Tilt  10 reps    Prone on Elbows Stretch  30 seconds    Prone on Elbows Stretch Limitations  made leg pain worse, then did prone lying flat for 2 min no better but no worse, then did prone lying over pillow and this centralized leg symptoms    Gastroc Stretch  Right;3 reps;30 seconds      Modalities   Modalities  Moist Heat;Electrical Stimulation      Moist Heat Therapy   Number Minutes Moist Heat  15 Minutes    Moist Heat Location  Lumbar Spine      Electrical  Stimulation   Electrical Stimulation Location  Lumbar spine right     Electrical Stimulation Action  IFC    Electrical Stimulation Parameters  tolerance    Electrical Stimulation Goals  Pain      Manual Therapy   Manual Therapy  Joint mobilization;Soft tissue mobilization;Manual Traction    Joint Mobilization  sidelying lumbar rotational mobs grade 1-2 to tolerance    Soft tissue mobilization  STM to lumbar P.S and Rt glutes    Manual Traction  gentle LAQ bilat, but when performed on Lt leg caused Rt leg symptoms             PT Education - 06/20/18 0927    Education Details  try flexion based stretching and prone lying over pillow to monitor centralization    Person(s) Educated  Patient    Methods  Explanation;Demonstration    Comprehension  Verbalized understanding;Returned demonstration       PT Short Term Goals - 06/12/18 1606      PT SHORT TERM GOAL #1   Title  Patienti will sit without shifting to the left for 5 minutes     Time  3     Period  Weeks    Status  New    Target Date  07/03/18      PT SHORT TERM GOAL #2   Title  Patient will demonstrate 4+/5 gross seated Bilateral LE strength     Time  3    Period  Weeks    Status  New    Target Date  07/03/18      PT SHORT TERM GOAL #3   Title  Patient will be indepdent with basic HEP     Time  3    Period  Weeks    Status  New    Target Date  07/03/18        PT Long Term Goals - 06/12/18 1607      PT LONG TERM GOAL #1   Title  Patient will sleep through the night without pain     Time  6    Period  Weeks    Status  New    Target Date  07/24/18      PT LONG TERM GOAL #2   Title  Patient will stand for 30 minutes without increased pain in order to perfrom ADL's     Time  6    Period  Weeks    Status  New    Target Date  07/03/18      PT LONG TERM GOAL #3   Title  Paytient will be Indepdnent with a complete lower back program to promote fitness and future back health     Time  6    Period  Weeks    Status  New    Target Date  07/24/18            Plan - 06/20/18 2035    Clinical Impression Statement  Session focused today on pain managment and began with MHP with TENS today as she had high irritability of sypmtoms. She had periphalization with extension today as well as LAQ but then placed prone into flexion (pillow under hips) she had centralization. She was instructed to try this at home and shown other flexion based stretches to see if this continues to centralize her symptoms.     Personal Factors and Comorbidities  Comorbidity 1;Profession    Comorbidities  obesity     Examination-Activity Limitations  Bend;Sit;Squat;Reach Overhead;Carry    Examination-Participation Restrictions  Yard Work;Laundry;Cleaning;Community Activity;Driving;Shop    Stability/Clinical Decision Making  Evolving/Moderate complexity   reactive pain that radiates into her leg with any prolonged positioning    Rehab Potential  Good    PT Frequency  2x / week    PT  Duration  6 weeks    PT Treatment/Interventions  ADLs/Self Care Home Management;Cryotherapy;Electrical Stimulation;Iontophoresis 30m/ml Dexamethasone;Moist Heat;Spinal Manipulations;Joint Manipulations;Passive range of motion;Neuromuscular re-education;Patient/family education;Gait training;Traction;Functional mobility training;Therapeutic activities;Therapeutic exercise    PT Next Visit Plan  continue with LAD on the right but may need the left; consider side lying lumbar mobilizations; continue to try to correct the lalteral shift to the left; assess pelvis if patient able to lie flat ( patient had a trauma earlier in the year); begin core strengthening; begin LE strengthening when able modalities PRN; if we can cerntralize her pain consder a manip;     PT Home Exercise Plan  seated hamstring stretch ( respded well to flexion depsite having more pain in sitting) tennis ball trigger point release. advised patient to be aware of posture     Consulted and Agree with Plan of Care  Patient       Patient will benefit from skilled therapeutic intervention in order to improve the following deficits and impairments:  Abnormal gait, Difficulty walking, Decreased endurance, Decreased range of motion, Obesity, Improper body mechanics, Decreased strength, Decreased activity tolerance, Decreased coordination  Visit Diagnosis: Acute bilateral low back pain with right-sided sciatica  Abnormal posture  Muscle weakness (generalized)     Problem List Patient Active Problem List   Diagnosis Date Noted  . Acute right lumbar radiculopathy 06/04/2018  . Chronic low back pain 06/04/2018  . Wound, open, abdominal wall, lateral 06/08/2015  . Hernia of abdominal wall 06/08/2015  . 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 Allergy or toxic reaction to venom 06/08/2015  . Edema in pregnancy, postpartum condition 06/07/2015  . Status post primary low transverse cesarean section--FTD  06/02/2015  . Pre-eclampsia in third trimester 06/02/2015  . Pregnancy induced hypertension 05/31/2015  . Morbid obesity with BMI of 40.0-44.9, adult ([facility] 05/20/2015  . Diastasis recti 05/20/2015  . Hx of peptic ulcer 05/20/2015    BSilvestre Mesi5/02/2018, 9:30 AM  [place] [address]Phone: [phone]  Fax:  [phone] Name: Regina QuilterMRN: [id number] of Birth: [birth year]

## 2018-06-23 ENCOUNTER — Telehealth: Payer: Self-pay | Admitting: Family Medicine

## 2018-06-23 NOTE — Telephone Encounter (Signed)
Spoke to patient and she would like to go ahead and do the MRI and hold off on the gabapentin at this time.

## 2018-06-23 NOTE — Telephone Encounter (Signed)
Patient requesting a call back to discuss current status.  States physical therapy has helped some with her back pain but is still experiencing pain in her right leg

## 2018-06-23 NOTE — Telephone Encounter (Signed)
Ok to go ahead

## 2018-06-23 NOTE — Telephone Encounter (Signed)
Next step if possible would be to see if we can get a lumbar spine MRI on her now for lumbar radiculopathy.  She can consider gabapentin to add to her physical therapy in meantime as well.  Let me know how she would like to proceed - can go ahead with auth for MRI if she chooses this route.  Thanks!

## 2018-06-24 NOTE — Addendum Note (Signed)
Addended by: Kathi Simpers F on: 06/24/2018 11:59 AM   Modules accepted: Orders

## 2018-06-24 NOTE — Telephone Encounter (Signed)
Order sent for MRI 

## 2018-06-26 ENCOUNTER — Ambulatory Visit: Payer: BLUE CROSS/BLUE SHIELD | Attending: Family Medicine | Admitting: Physical Therapy

## 2018-07-08 ENCOUNTER — Other Ambulatory Visit: Payer: Self-pay

## 2018-07-08 ENCOUNTER — Ambulatory Visit
Admission: RE | Admit: 2018-07-08 | Discharge: 2018-07-08 | Disposition: A | Discharge status: Home / Self Care | Payer: BLUE CROSS/BLUE SHIELD | Source: Ambulatory Visit | Attending: Family Medicine | Admitting: Family Medicine

## 2018-07-08 DIAGNOSIS — M5416 Radiculopathy, lumbar region: Secondary | ICD-10-CM

## 2018-07-10 ENCOUNTER — Other Ambulatory Visit: Payer: Self-pay | Admitting: Family Medicine

## 2018-07-10 DIAGNOSIS — M5416 Radiculopathy, lumbar region: Secondary | ICD-10-CM

## 2018-07-28 ENCOUNTER — Other Ambulatory Visit: Payer: Self-pay

## 2018-07-28 ENCOUNTER — Ambulatory Visit
Admission: RE | Admit: 2018-07-28 | Discharge: 2018-07-28 | Disposition: A | Discharge status: Home / Self Care | Payer: BC Managed Care – PPO | Source: Ambulatory Visit | Attending: Family Medicine | Admitting: Family Medicine

## 2018-07-28 ENCOUNTER — Other Ambulatory Visit: Payer: Self-pay | Admitting: Family Medicine

## 2018-07-28 DIAGNOSIS — M5416 Radiculopathy, lumbar region: Secondary | ICD-10-CM

## 2018-07-28 MED ORDER — IOPAMIDOL (ISOVUE-M 200) INJECTION 41%: 1.0000 mL | Freq: Once | INTRAMUSCULAR | Status: DC

## 2018-07-28 MED ORDER — METHYLPREDNISOLONE ACETATE 40 MG/ML INJ SUSP (RADIOLOG: 120.0000 mg | Freq: Once | INTRAMUSCULAR | Status: DC

## 2018-07-28 NOTE — Discharge Instructions (Signed)

## 2018-08-13 ENCOUNTER — Other Ambulatory Visit: Payer: Self-pay | Admitting: *Deleted

## 2018-08-13 DIAGNOSIS — Z20822 Contact with and (suspected) exposure to covid-19: Secondary | ICD-10-CM

## 2018-08-17 LAB — NOVEL CORONAVIRUS, NAA: SARS-CoV-2, NAA: NOT DETECTED

## 2021-03-16 IMAGING — DX LUMBAR SPINE - COMPLETE 4+ VIEW
5 series · 5 of 5 positions shown · non-contrast
Comparison: 11/26/2017

CLINICAL DATA: Low back pain.  No trauma.

EXAM:
LUMBAR SPINE - COMPLETE 4+ VIEW

[l-spine ap]
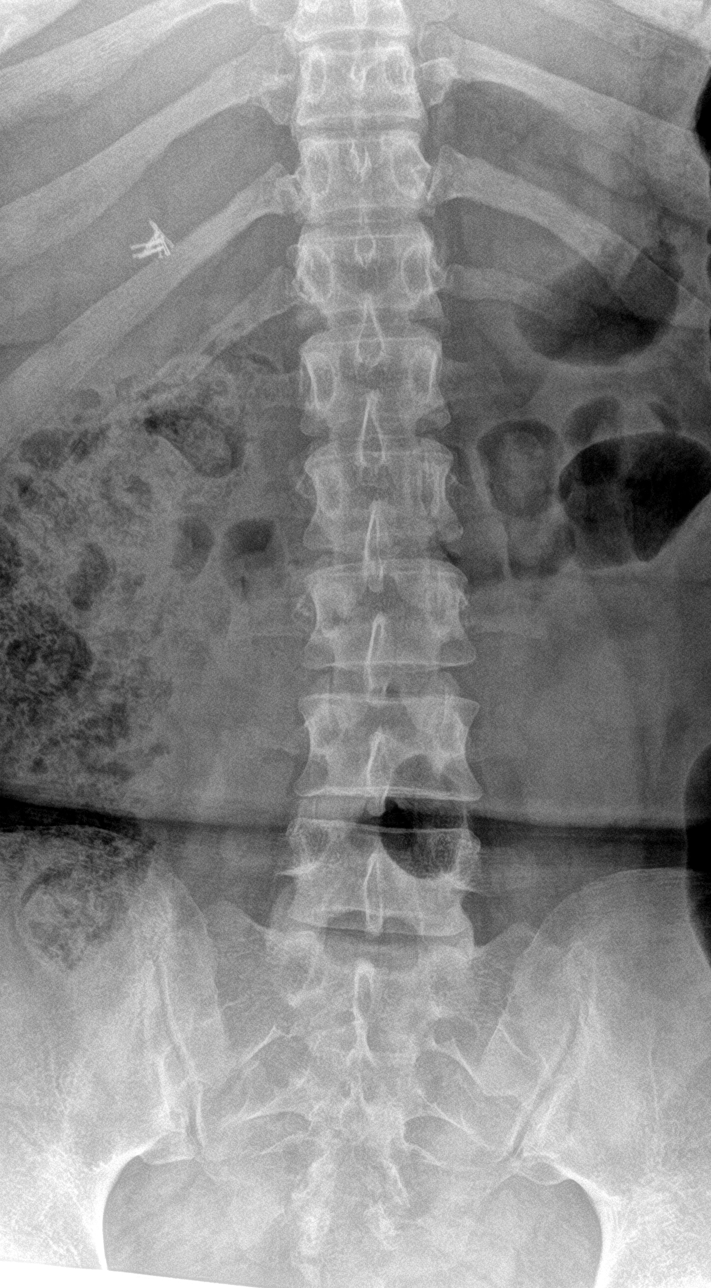

[l-spine obl (1 of 2)]
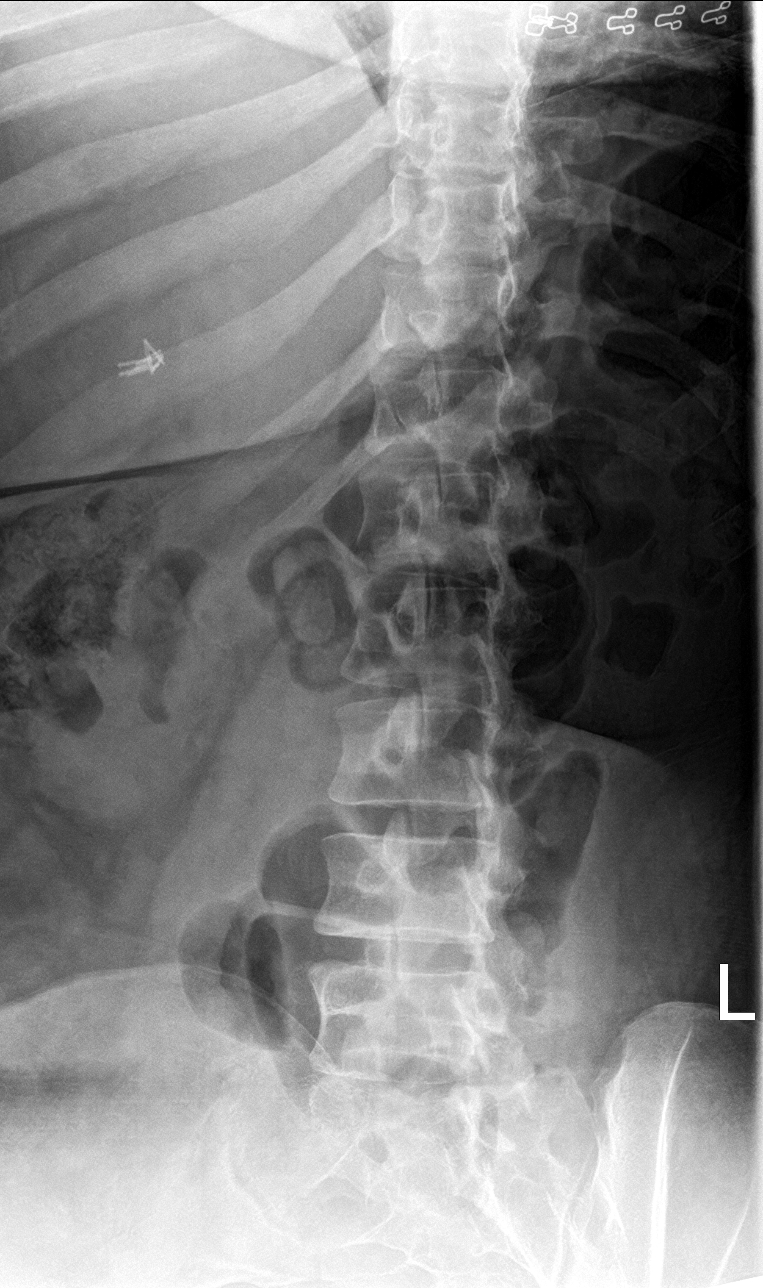

[l-spine obl (2 of 2)]
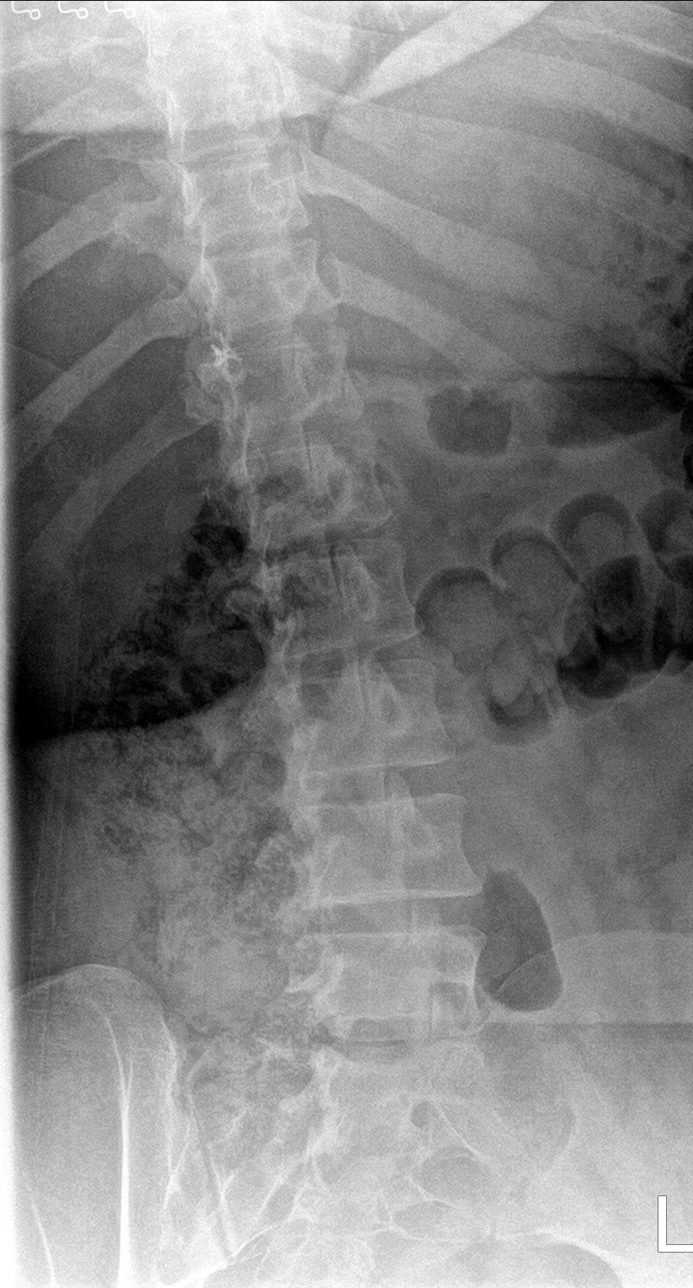

[l-spine lat]
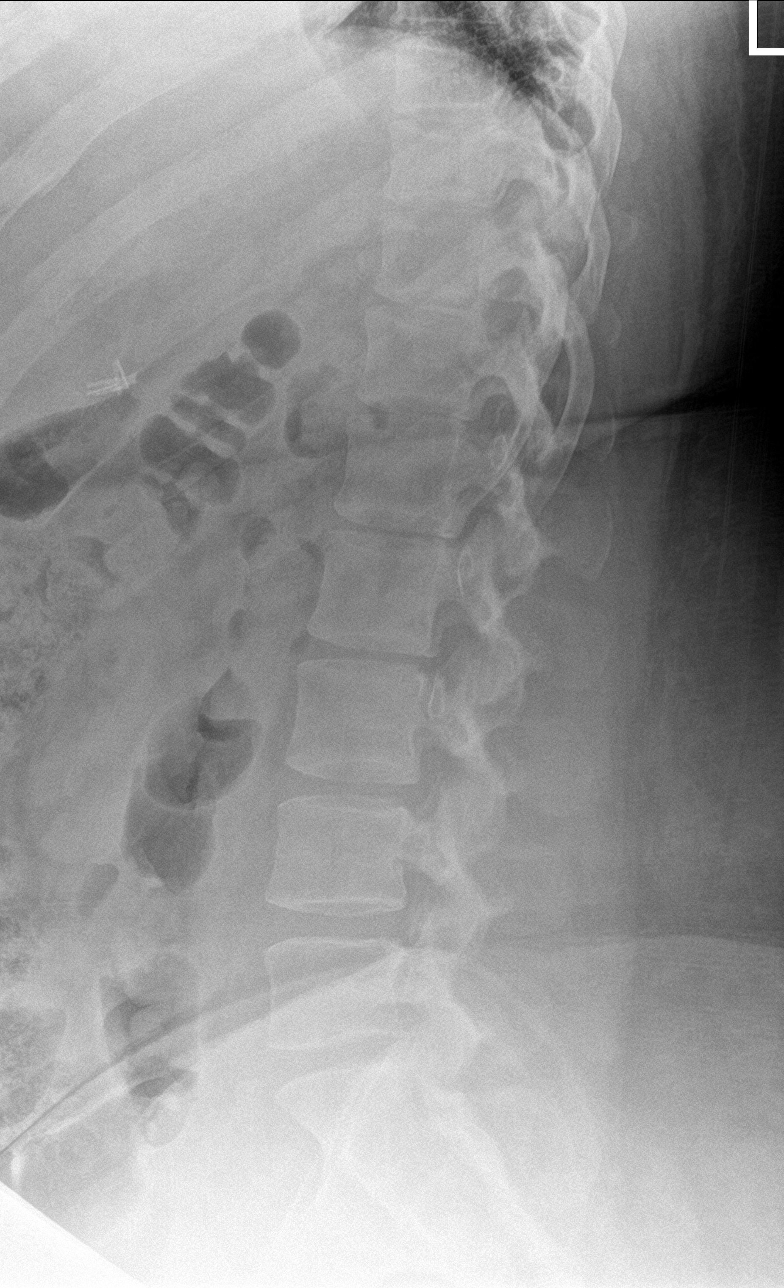

[l-spine spot]
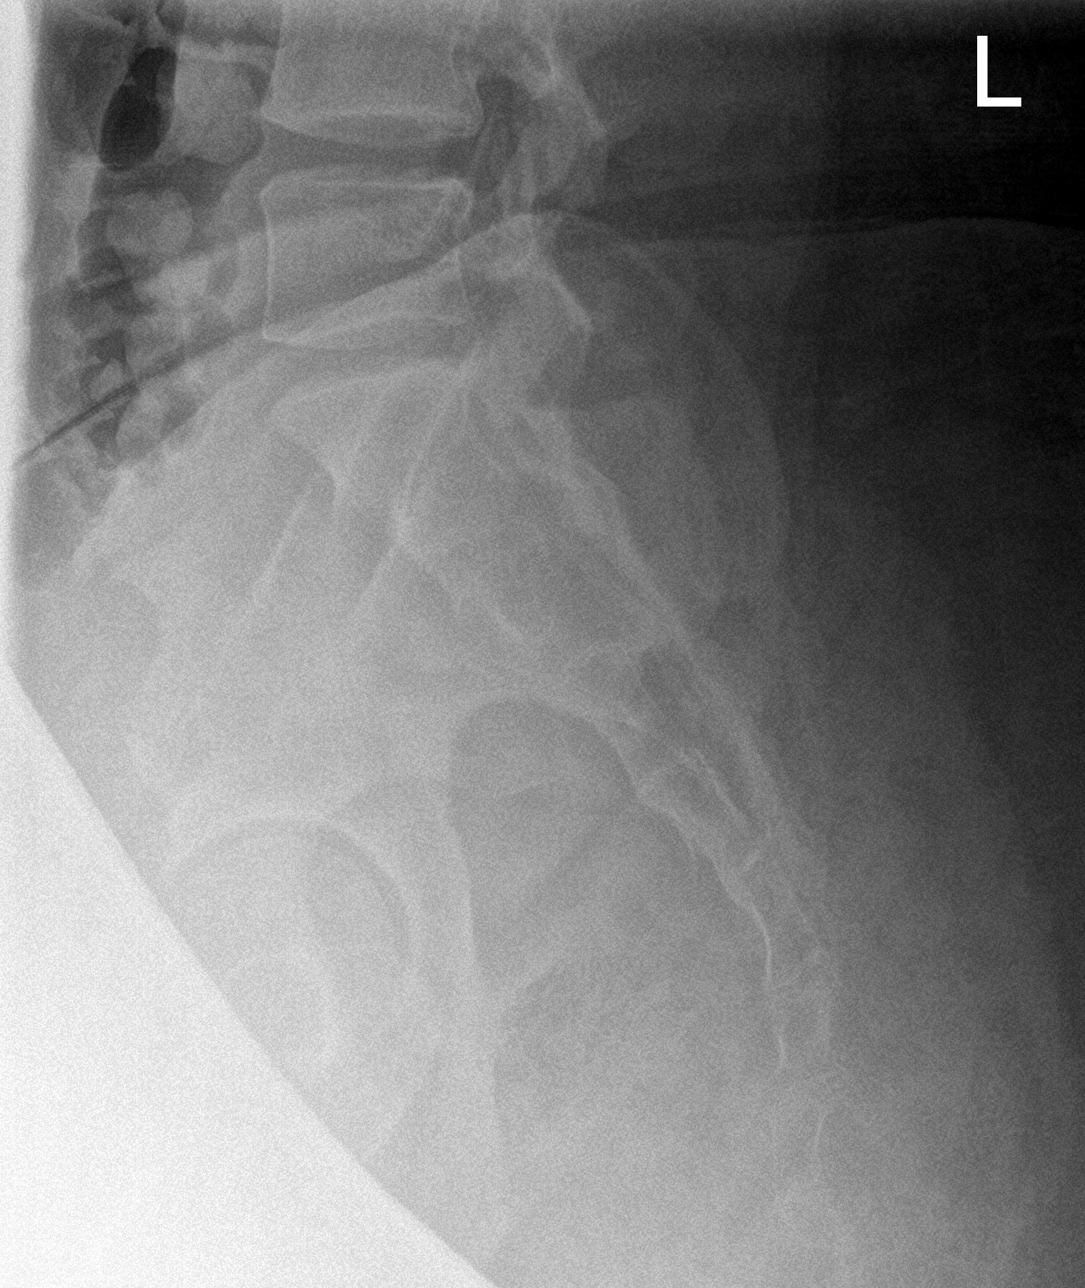

[5 of 5 positions shown; findings below may reference images not displayed]

FINDINGS: Cholecystectomy. Large colonic stool burden. Five lumbar type
vertebral bodies. Sacroiliac joints are symmetric. Minimal convex
left lumbar spine curvature. Maintenance of vertebral body height
and alignment. Nonspecific straightening of expected lordosis.
Intervertebral disc heights are maintained.
IMPRESSION: No acute osseous abnormality.
